# Patient Record
Sex: Male | Born: 1975 | Race: White | Hispanic: No | Marital: Single | Smoking: Current every day smoker
Health system: Southern US, Community
[De-identification: ages and names within clinical notes are randomized; demographics above are authoritative.]

## PROBLEM LIST (undated history)

## (undated) DIAGNOSIS — F200 Paranoid schizophrenia: Secondary | ICD-10-CM

---

## 2006-01-12 ENCOUNTER — Ambulatory Visit: Payer: Self-pay | Admitting: Internal Medicine

## 2007-11-27 IMAGING — US ABDOMEN ULTRASOUND
1 series · 17 of 25 positions shown · non-contrast
Comparison: none

REASON FOR EXAM: Elevated LFT's
COMMENTS:

[Series 1: abdomen ultrasound · 17 of 61 slices shown]
[im 1/61]
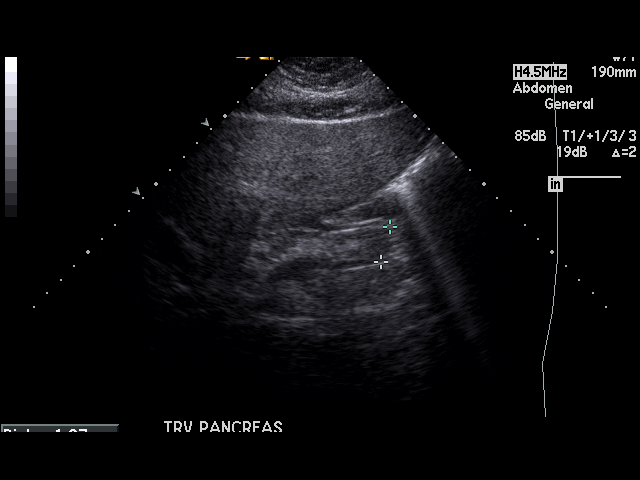
[im 6/61]
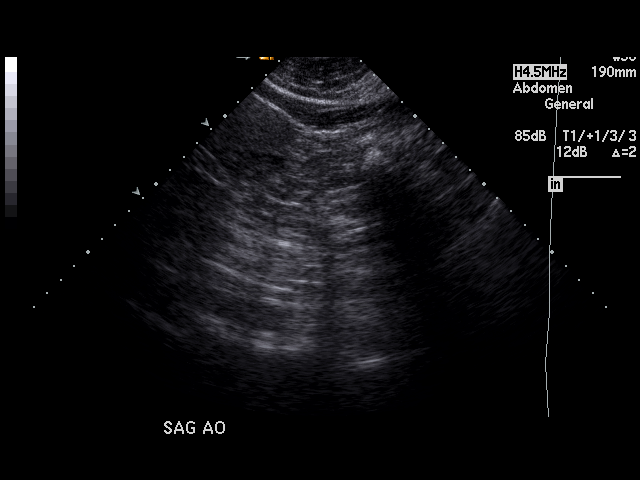
[im 8/61]
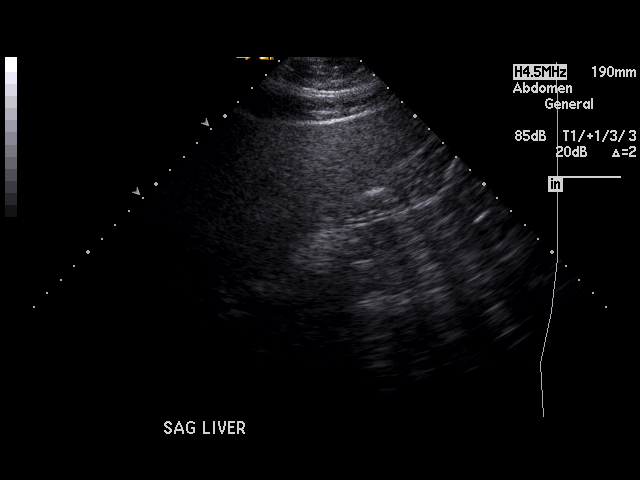
[im 13/61]
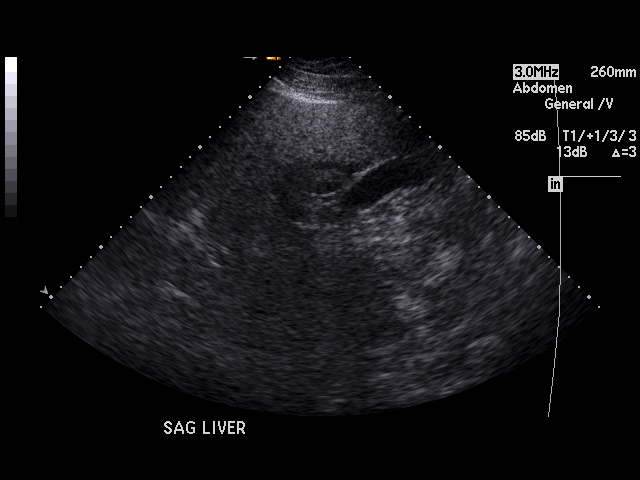
[im 16/61]
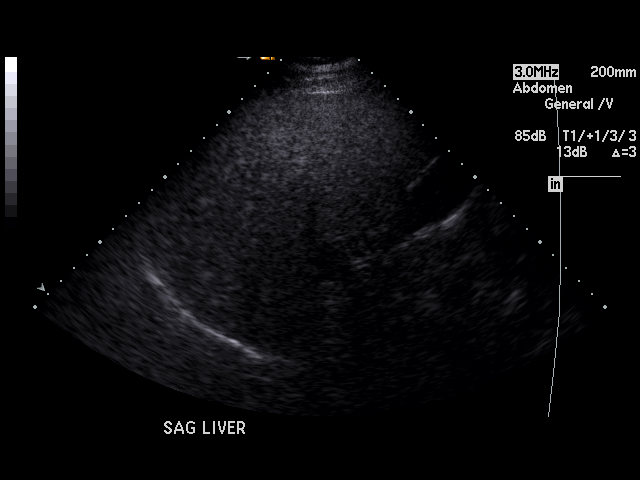
[im 21/61]
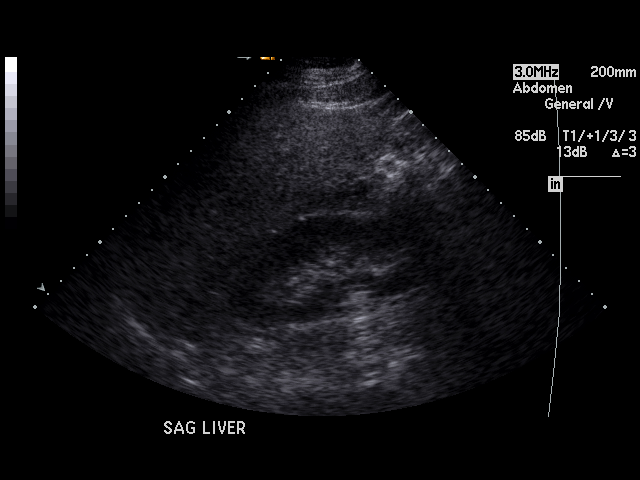
[im 23/61]
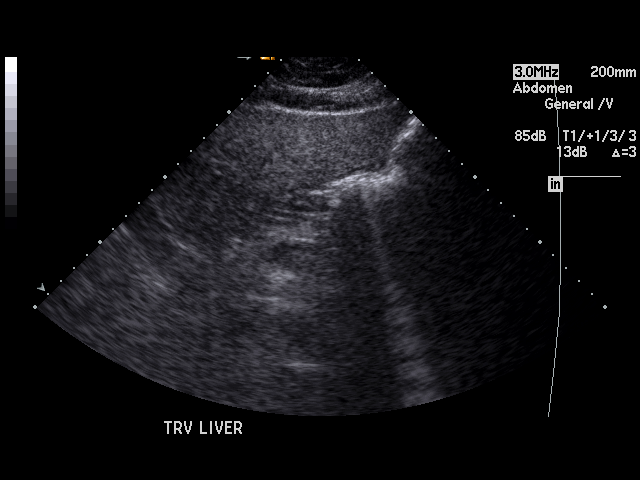
[im 28/61]
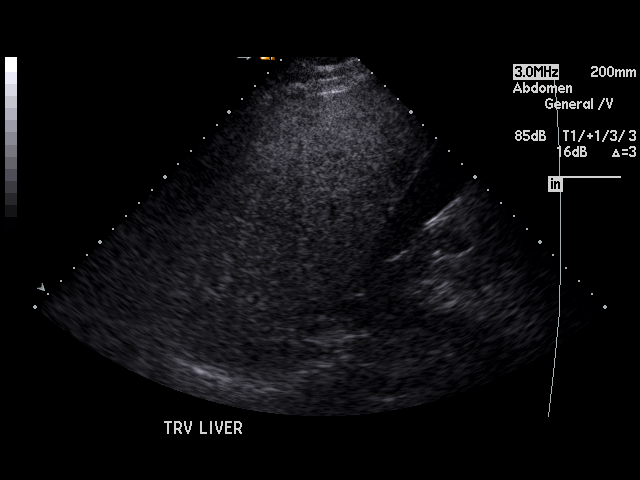
[im 31/61]
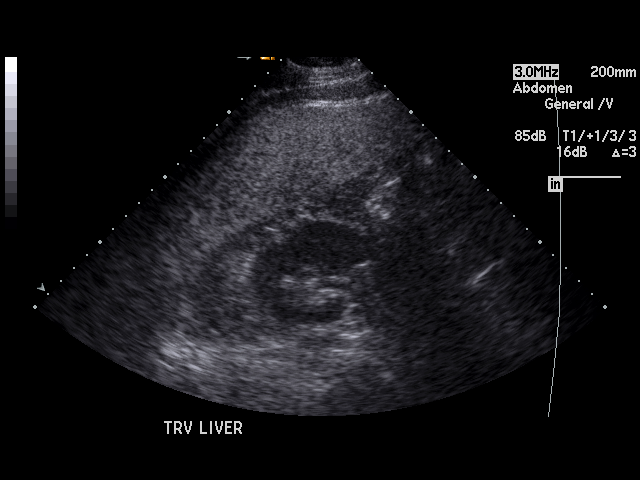
[im 33/61]
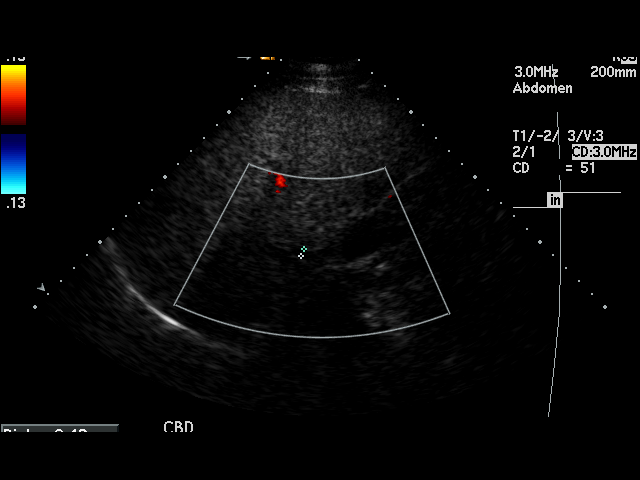
[im 38/61]
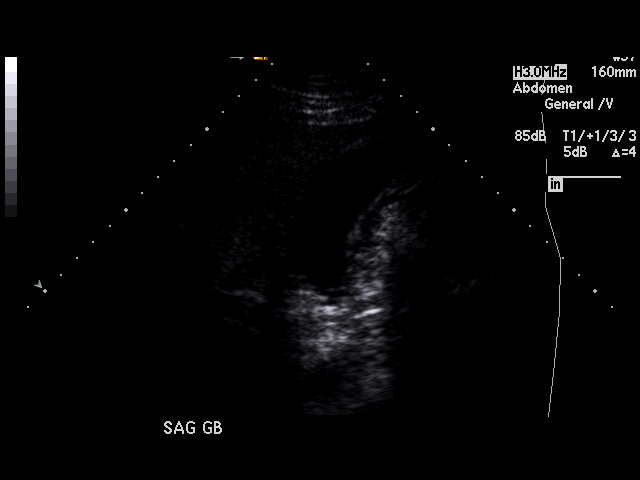
[im 41/61]
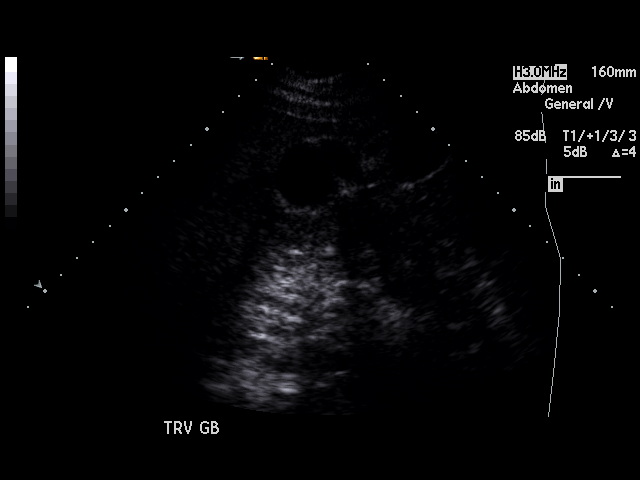
[im 46/61]
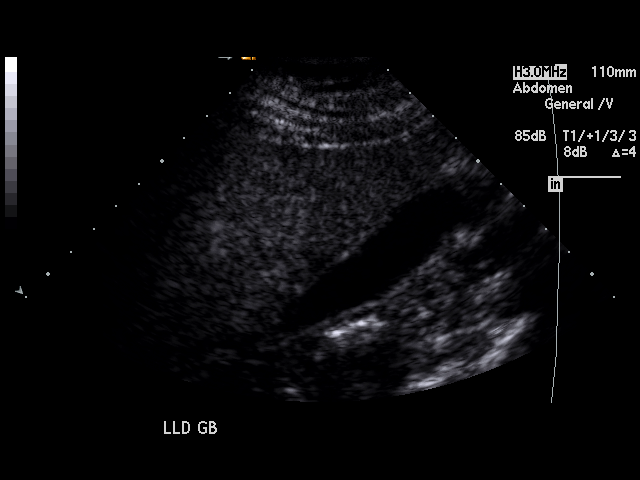
[im 48/61]
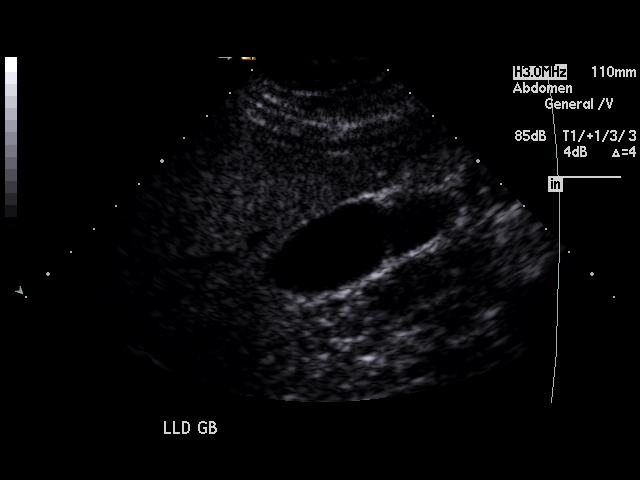
[im 53/61]
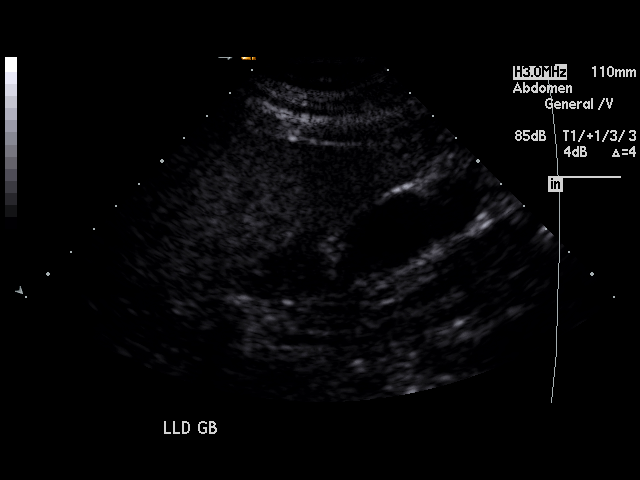
[im 56/61]
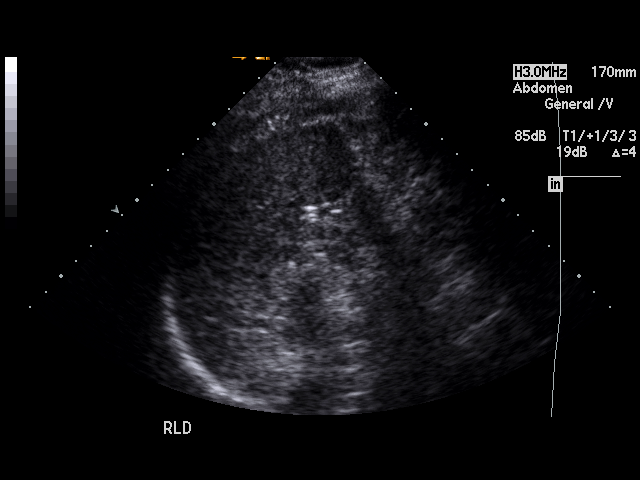
[im 61/61]
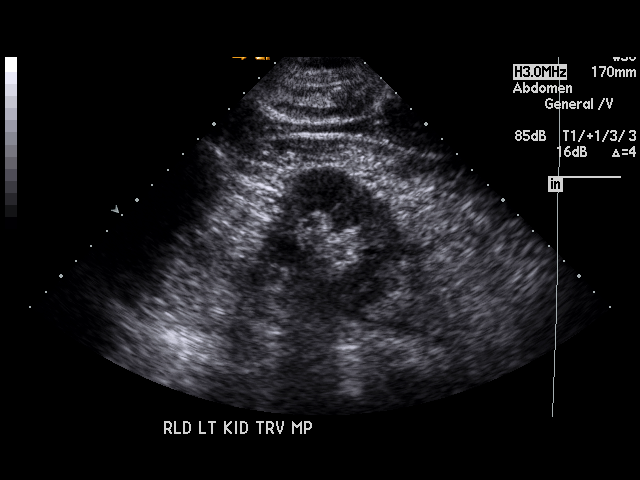

[17 of 25 positions shown; findings below may reference images not displayed]

PROCEDURE:     US  - US ABDOMEN GENERAL SURVEY  - January 12, 2006  [DATE]

RESULT:     The liver demonstrates an increased heterogeneous echotexture.
Common bile duct measures 4.2 mm in diameter.  There is no evidence of intra
or extrahepatic biliary ductal dilatation.  Hepatopetal flow is demonstrated
within the portal vein.  The spleen and kidneys are unremarkable.
Evaluation of the gallbladder demonstrates gallbladder wall thickness of
cm.  There is no evidence of pericholecystic, fluid, stones, sludging or
sonographic Murphy's sign.  The pancreas is not visualized secondary to
bowel gas.
IMPRESSION: Ultrasound findings consistent with hepatic steatosis without further
sonographic abnormalities.

## 2008-11-24 ENCOUNTER — Inpatient Hospital Stay: Payer: Self-pay | Admitting: Psychiatry

## 2009-02-12 ENCOUNTER — Inpatient Hospital Stay: Payer: Self-pay | Admitting: Psychiatry

## 2010-05-29 ENCOUNTER — Ambulatory Visit: Payer: Self-pay | Admitting: Internal Medicine

## 2022-09-22 ENCOUNTER — Emergency Department
Admission: EM | Admit: 2022-09-22 | Discharge: 2022-09-23 | Disposition: A | Discharge status: Home / Self Care | Payer: Medicare Other | Source: Home / Self Care | Attending: Emergency Medicine | Admitting: Emergency Medicine

## 2022-09-22 ENCOUNTER — Encounter: Payer: Self-pay | Admitting: Emergency Medicine

## 2022-09-22 ENCOUNTER — Other Ambulatory Visit: Payer: Self-pay

## 2022-09-22 DIAGNOSIS — F29 Unspecified psychosis not due to a substance or known physiological condition: Secondary | ICD-10-CM | POA: Insufficient documentation

## 2022-09-22 DIAGNOSIS — R45851 Suicidal ideations: Secondary | ICD-10-CM | POA: Insufficient documentation

## 2022-09-22 DIAGNOSIS — F200 Paranoid schizophrenia: Secondary | ICD-10-CM | POA: Insufficient documentation

## 2022-09-22 HISTORY — DX: Paranoid schizophrenia: F20.0

## 2022-09-22 LAB — URINE DRUG SCREEN, QUALITATIVE (ARMC ONLY)
Amphetamines, Ur Screen: NOT DETECTED
Barbiturates, Ur Screen: NOT DETECTED
Benzodiazepine, Ur Scrn: NOT DETECTED
Cannabinoid 50 Ng, Ur ~~LOC~~: NOT DETECTED
Cocaine Metabolite,Ur ~~LOC~~: NOT DETECTED
MDMA (Ecstasy)Ur Screen: NOT DETECTED
Methadone Scn, Ur: NOT DETECTED
Opiate, Ur Screen: NOT DETECTED
Phencyclidine (PCP) Ur S: NOT DETECTED
Tricyclic, Ur Screen: NOT DETECTED

## 2022-09-22 LAB — CBC
HCT: 49.3 % (ref 39.0–52.0)
Hemoglobin: 17.2 g/dL — ABNORMAL HIGH (ref 13.0–17.0)
MCH: 30.3 pg (ref 26.0–34.0)
MCHC: 34.9 g/dL (ref 30.0–36.0)
MCV: 86.8 fL (ref 80.0–100.0)
Platelets: 199 10*3/uL (ref 150–400)
RBC: 5.68 MIL/uL (ref 4.22–5.81)
RDW: 13.2 % (ref 11.5–15.5)
WBC: 7.8 10*3/uL (ref 4.0–10.5)
nRBC: 0 % (ref 0.0–0.2)

## 2022-09-22 LAB — COMPREHENSIVE METABOLIC PANEL
ALT: 17 U/L (ref 0–44)
AST: 19 U/L (ref 15–41)
Albumin: 4.5 g/dL (ref 3.5–5.0)
Alkaline Phosphatase: 100 U/L (ref 38–126)
Anion gap: 11 (ref 5–15)
BUN: 9 mg/dL (ref 6–20)
CO2: 22 mmol/L (ref 22–32)
Calcium: 9.1 mg/dL (ref 8.9–10.3)
Chloride: 102 mmol/L (ref 98–111)
Creatinine, Ser: 0.8 mg/dL (ref 0.61–1.24)
GFR, Estimated: >60 mL/min (ref >60)
Glucose, Bld: 131 mg/dL — ABNORMAL HIGH (ref 70–99)
Potassium: 2.8 mmol/L — ABNORMAL LOW (ref 3.5–5.1)
Sodium: 135 mmol/L (ref 135–145)
Total Bilirubin: 0.8 mg/dL (ref 0.3–1.2)
Total Protein: 7.6 g/dL (ref 6.5–8.1)

## 2022-09-22 LAB — SALICYLATE LEVEL: Salicylate Lvl: <7 mg/dL — ABNORMAL LOW (ref 7.0–30.0)

## 2022-09-22 LAB — ETHANOL: Alcohol, Ethyl (B): <10 mg/dL (ref <10)

## 2022-09-22 LAB — ACETAMINOPHEN LEVEL: Acetaminophen (Tylenol), Serum: <10 ug/mL — ABNORMAL LOW (ref 10–30)

## 2022-09-22 MED ORDER — POTASSIUM CHLORIDE CRYS ER 20 MEQ PO TBCR
40.0000 meq | EXTENDED_RELEASE_TABLET | Freq: Once | ORAL | Status: AC
  Administered 2022-09-22: 40 meq via ORAL
  Filled 2022-09-22: qty 2

## 2022-09-22 MED ORDER — HYDROCHLOROTHIAZIDE 12.5 MG PO TABS
12.5000 mg | ORAL_TABLET | Freq: Every day | ORAL | Status: DC
  Administered 2022-09-22 – 2022-09-23 (×2): 12.5 mg via ORAL
  Filled 2022-09-22 (×2): qty 1

## 2022-09-22 MED ORDER — LISINOPRIL 10 MG PO TABS
20.0000 mg | ORAL_TABLET | Freq: Every day | ORAL | Status: DC
  Administered 2022-09-22 – 2022-09-23 (×2): 20 mg via ORAL
  Filled 2022-09-22 (×2): qty 2

## 2022-09-22 NOTE — ED Triage Notes (Addendum)
Pt via [facility] from home. Pt under IVC. Pt has a hx of paranoid schizophrenia. Per mom, pt has not been taking medications. Pt has been "arguing and talking to people who aren't." Denies any SI/HI. Denies AVH. States he is an occasional drinker and smoker. Pt is calm and cooperative during triage. States that he does get biweekly risperodone shots, last shot was Friday but states he does miss some doses because it makes him feel slow.   Pt continue to state that he has been dealing with "consumptions" States she does not know why his mother thinks he is paranoid.

## 2022-09-22 NOTE — ED Provider Notes (Signed)
[facility] Provider Note    Event Date/Time   First MD Initiated Contact with Patient 09/22/22 2030     (approximate)   History   Chief Complaint Psychiatric Evaluation   HPI  Terrance Watkins is a 47 y.o. male with past medical history of schizophrenia who presents to the ED for psychiatric evaluation.  Patient brought to the ED under IVC after his mother stated he has not been taking his medications and has been "arguing with people who are not there."  Patient states that he is feeling fine, denies any suicidal or homicidal ideation.  He does state that he is seeking help with "consumptions" and would like to be evaluated.  He denies any specific medical complaints, denies any alcohol or drug use.     Physical Exam   Triage Vital Signs: ED Triage Vitals  Enc Vitals Group     BP 09/22/22 1805 (!) 234/111     Pulse Rate 09/22/22 1804 99     Resp 09/22/22 1804 18     Temp 09/22/22 1804 98.1 F (36.7 C)     Temp Source 09/22/22 1804 Oral     SpO2 09/22/22 1804 95 %     Weight 09/22/22 1802 215 lb (97.5 kg)     Height 09/22/22 1802 5\' 11"  (1.803 m)     Head Circumference --      Peak Flow --      Pain Score 09/22/22 1800 0     Pain Loc --      Pain Edu? --      Excl. in GC? --     Most recent vital signs: Vitals:   09/22/22 1804 09/22/22 1805  BP:  (!) 234/111  Pulse: 99   Resp: 18   Temp: 98.1 F (36.7 C)   SpO2: 95%     Constitutional: Alert and oriented. Eyes: Conjunctivae are normal. Head: Atraumatic. Nose: No congestion/rhinnorhea. Mouth/Throat: Mucous membranes are moist.  Cardiovascular: Normal rate, regular rhythm. Grossly normal heart sounds.  2+ radial pulses bilaterally. Respiratory: Normal respiratory effort.  No retractions. Lungs CTAB. Gastrointestinal: Soft and nontender. No distention. Musculoskeletal: No lower extremity tenderness nor edema.  Neurologic:  Normal speech and language. No gross focal neurologic deficits  are appreciated.    ED Results / Procedures / Treatments   Labs (all labs ordered are listed, but only abnormal results are displayed) Labs Reviewed  COMPREHENSIVE METABOLIC PANEL - Abnormal; Notable for the following components:      Result Value   Potassium 2.8 (*)    Glucose, Bld 131 (*)    All other components within normal limits  SALICYLATE LEVEL - Abnormal; Notable for the following components:   Salicylate Lvl <7.0 (*)    All other components within normal limits  ACETAMINOPHEN LEVEL - Abnormal; Notable for the following components:   Acetaminophen (Tylenol), Serum <10 (*)    All other components within normal limits  CBC - Abnormal; Notable for the following components:   Hemoglobin 17.2 (*)    All other components within normal limits  ETHANOL  URINE DRUG SCREEN, QUALITATIVE ([facility] ONLY)    PROCEDURES:  Critical Care performed: No  Procedures   MEDICATIONS ORDERED IN ED: Medications  lisinopril (ZESTRIL) tablet 20 mg (has no administration in time range)  hydrochlorothiazide (HYDRODIURIL) tablet 12.5 mg (has no administration in time range)  potassium chloride SA (KLOR-CON M) CR tablet 40 mEq (40 mEq Oral Given 09/22/22 2137)  IMPRESSION / MDM / ASSESSMENT AND PLAN / ED COURSE  I reviewed the triage vital signs and the nursing notes.                              48 y.o. male with past medical history of schizophrenia presents to the ED for psychiatric evaluation after mother reported that he was acting increasingly erratic and not taking his medications.  Patient's presentation is most consistent with acute presentation with potential threat to life or bodily function.  Differential diagnosis includes, but is not limited to, psychosis, depression, anxiety, medication noncompliance, substance abuse.  Patient nontoxic-appearing and in no acute distress, vital signs remarkable for hypertension but otherwise reassuring.  Patient denies any medical  complaints, does have erratic behavior but is calm and cooperative.  Labs are reassuring with hyperkalemia which we will replete, no significant AKI, anemia, or leukocytosis.  LFTs are unremarkable, Tylenol and salicylate levels are undetectable. Patient may be medically cleared for psychiatric disposition, no evidence of hypertensive emergency and we will restart his blood pressure medication. Psych eval pending at this time.  The patient has been placed in psychiatric observation due to the need to provide a safe environment for the patient while obtaining psychiatric consultation and evaluation, as well as ongoing medical and medication management to treat the patient's condition.  The patient has been placed under full IVC at this time.       FINAL CLINICAL IMPRESSION(S) / ED DIAGNOSES   Final diagnoses:  Psychosis, unspecified psychosis type (HCC)     Rx / DC Orders   ED Discharge Orders     None        Note:  This document was prepared using Dragon voice recognition software and may include unintentional dictation errors.   [name], MD 09/22/22 [phone]

## 2022-09-22 NOTE — ED Notes (Signed)
Pt. Alert and oriented, warm and dry, in no distress. Pt. Denies SI, HI, and AVH. Pt is having delusional thoughts and states some one has been placing wires in him. Pt. Encouraged to let nursing staff know of any concerns or needs.

## 2022-09-23 ENCOUNTER — Inpatient Hospital Stay
Admission: AD | Admit: 2022-09-23 | Discharge: 2022-10-03 | DRG: 885 | Disposition: A | Discharge status: Home / Self Care | Payer: Medicare Other | Source: Intra-hospital | Attending: Psychiatry | Admitting: Psychiatry

## 2022-09-23 DIAGNOSIS — R45851 Suicidal ideations: Secondary | ICD-10-CM

## 2022-09-23 DIAGNOSIS — F17210 Nicotine dependence, cigarettes, uncomplicated: Secondary | ICD-10-CM | POA: Diagnosis present

## 2022-09-23 DIAGNOSIS — Z79899 Other long term (current) drug therapy: Secondary | ICD-10-CM

## 2022-09-23 DIAGNOSIS — F200 Paranoid schizophrenia: Principal | ICD-10-CM | POA: Diagnosis present

## 2022-09-23 DIAGNOSIS — I10 Essential (primary) hypertension: Secondary | ICD-10-CM | POA: Diagnosis present

## 2022-09-23 DIAGNOSIS — F411 Generalized anxiety disorder: Secondary | ICD-10-CM | POA: Diagnosis present

## 2022-09-23 DIAGNOSIS — Z91148 Patient's other noncompliance with medication regimen for other reason: Secondary | ICD-10-CM | POA: Diagnosis not present

## 2022-09-23 DIAGNOSIS — G4700 Insomnia, unspecified: Secondary | ICD-10-CM | POA: Diagnosis present

## 2022-09-23 MED ORDER — DIPHENHYDRAMINE HCL 25 MG PO CAPS: 50.0000 mg | ORAL_CAPSULE | Freq: Three times a day (TID) | ORAL | Status: DC | PRN

## 2022-09-23 MED ORDER — ACETAMINOPHEN 325 MG PO TABS: 650.0000 mg | ORAL_TABLET | Freq: Four times a day (QID) | ORAL | Status: DC | PRN

## 2022-09-23 MED ORDER — LORAZEPAM 2 MG PO TABS: 2.0000 mg | ORAL_TABLET | Freq: Three times a day (TID) | ORAL | Status: DC | PRN

## 2022-09-23 MED ORDER — HALOPERIDOL 5 MG PO TABS: 5.0000 mg | ORAL_TABLET | Freq: Three times a day (TID) | ORAL | Status: DC | PRN

## 2022-09-23 MED ORDER — LISINOPRIL 20 MG PO TABS
20.0000 mg | ORAL_TABLET | Freq: Every day | ORAL | Status: DC
  Administered 2022-09-24 – 2022-10-03 (×10): 20 mg via ORAL
  Filled 2022-09-23 (×10): qty 1

## 2022-09-23 MED ORDER — TRAZODONE HCL 50 MG PO TABS: 50.0000 mg | ORAL_TABLET | Freq: Every evening | ORAL | Status: DC | PRN

## 2022-09-23 MED ORDER — HYDROXYZINE HCL 50 MG PO TABS: 50.0000 mg | ORAL_TABLET | Freq: Three times a day (TID) | ORAL | Status: DC | PRN

## 2022-09-23 MED ORDER — MAGNESIUM HYDROXIDE 400 MG/5ML PO SUSP: 30.0000 mL | Freq: Every day | ORAL | Status: DC | PRN

## 2022-09-23 MED ORDER — DIPHENHYDRAMINE HCL 50 MG/ML IJ SOLN: 50.0000 mg | Freq: Three times a day (TID) | INTRAMUSCULAR | Status: DC | PRN

## 2022-09-23 MED ORDER — ALUM & MAG HYDROXIDE-SIMETH 200-200-20 MG/5ML PO SUSP: 30.0000 mL | ORAL | Status: DC | PRN

## 2022-09-23 MED ORDER — NICOTINE 14 MG/24HR TD PT24
14.0000 mg | MEDICATED_PATCH | Freq: Every day | TRANSDERMAL | Status: DC
  Filled 2022-09-23 (×7): qty 1

## 2022-09-23 MED ORDER — HYDROCHLOROTHIAZIDE 12.5 MG PO TABS
12.5000 mg | ORAL_TABLET | Freq: Every day | ORAL | Status: DC
  Administered 2022-09-24 – 2022-09-30 (×7): 12.5 mg via ORAL
  Filled 2022-09-23 (×7): qty 1

## 2022-09-23 MED ORDER — LORAZEPAM 2 MG/ML IJ SOLN: 2.0000 mg | Freq: Three times a day (TID) | INTRAMUSCULAR | Status: DC | PRN

## 2022-09-23 MED ORDER — HALOPERIDOL LACTATE 5 MG/ML IJ SOLN: 5.0000 mg | Freq: Three times a day (TID) | INTRAMUSCULAR | Status: DC | PRN

## 2022-09-23 NOTE — BH Assessment (Signed)
Spoke with ACT Team Lead Liborio Nixon), and updated her about the patient been admitted to BMU.

## 2022-09-23 NOTE — ED Notes (Signed)
Patient is taking a shower at this time. 

## 2022-09-23 NOTE — ED Notes (Signed)
ivc/pending consult.. 
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
## 2022-09-23 NOTE — Tx Team (Signed)
Initial Treatment Plan 09/23/2022 3:49 PM Terrance Watkins ZOX:[id number]    PATIENT STRESSORS: Other: Delusional, paranoid     PATIENT STRENGTHS: Supportive family/friends    PATIENT IDENTIFIED PROBLEMS: Patient unable to identify a reason for being here. He continues to talk about "cultivations," and how his mom thinks he is paranoid but he is not.                     DISCHARGE CRITERIA:  Adequate post-discharge living arrangements Improved stabilization in mood, thinking, and/or behavior  PRELIMINARY DISCHARGE PLAN: Return to previous living arrangement  PATIENT/FAMILY INVOLVEMENT: This treatment plan has been presented to and reviewed with the patient, Terrance Watkins.  The patient and family have been given the opportunity to ask questions and make suggestions.  Cherre Blanc, RN 09/23/2022, 3:49 PM

## 2022-09-23 NOTE — Progress Notes (Signed)
Copy of LG papers placed in chart.

## 2022-09-23 NOTE — ED Notes (Signed)
Receiving RN took report for patient, however there is another new patient coming onto unit, so nurse will call once the other new admission is completed.

## 2022-09-23 NOTE — Plan of Care (Signed)
47 yo Caucasian male admitted on involuntary status R/T currently mental health status. Pt present A&O x 3, speech loud and rapid, with delusional and paranoid thoughts. Pt required constant redirections to stay on task, mood became irritable when redirected and he's not able to ramble on regarding his delusions. Pt refused to sign consent to sign his treatment agreement and restrictive process/procedure and denied having a guardian in which guardianship papers are now file in his records. Pt denied having any health issues except "a little stuff and I have not been taking my medications, Pt BP was elevated and he stated he not taking medications because he doesn't believe he has BP problems. Pt denied having SI, HI, A/V/H, he had a body search/inspection, feet has fungus otherwise skin unremarkable.Pt was oriented to the unit, given toiletries and offer  snacks. Will continue to monitor for safety and provide support as needed and according to plan of care.

## 2022-09-23 NOTE — Plan of Care (Signed)
  Problem: Physical Regulation: Goal: Ability to maintain clinical measurements within normal limits will improve Outcome: Progressing   Problem: Safety: Goal: Periods of time without injury will increase Outcome: Progressing   Problem: Education: Goal: Utilization of techniques to improve thought processes will improve Outcome: Progressing Goal: Knowledge of the prescribed therapeutic regimen will improve Outcome: Progressing   

## 2022-09-23 NOTE — ED Notes (Signed)
Lunch tray provided. 

## 2022-09-23 NOTE — BH Assessment (Signed)
Comprehensive Clinical Assessment (CCA) Note  09/23/2022 Terrance Watkins [id number]  Chief Complaint: Patient is a 47 year old male presenting to [facility] ED under IVC. Per triage note Pt via BPD from home. Pt under IVC. Pt has a hx of paranoid schizophrenia. Per mom, pt has not been taking medications. Pt has been "arguing and talking to people who aren't." Denies any SI/HI. Denies AVH. States he is an occasional drinker and smoker. Pt is calm and cooperative during triage. States that he does get biweekly risperodone shots, last shot was Friday but states he does miss some doses because it makes him feel slow. Pt continue to state that he has been dealing with "consumptions" States she does not know why his mother thinks he is paranoid. During assessment patient appears alert and oriented x4, calm and cooperative with delusions. Patient reports "I've had some breakdowns about cultivations, about my head and my scalp, I lost some hair." "I thought it was my genes." "My mom will get on my nerves, we could get a long better, she has some cultivations and so do I, she might have a cloning device." Patient is at times difficult to redirect when discussing his delusions. Patient reports that he lives with his mother and is being seen by [facility], he reports that he receives an injection every 2 weeks but reports "it feels like there's some additives in my medications, I tried to tell my doctor but she doesn't tell me anything." Patient reports that he has been with this ACT team for 2 years. Patient denies SI/HI/AH/VH.  Chief Complaint  Patient presents with   Psychiatric Evaluation   Visit Diagnosis: Schizophrenia    CCA Screening, Triage and Referral (STR)  Patient Reported Information How did you hear about [place]? Legal System  Referral name: No data recorded Referral phone number: No data recorded  Whom do you see for routine medical problems? No data recorded Practice/Facility Name: No data  recorded Practice/Facility Phone Number: No data recorded Name of Contact: No data recorded Contact Number: No data recorded Contact Fax Number: No data recorded Prescriber Name: No data recorded Prescriber Address (if known): No data recorded  What Is the Reason for Your Visit/Call Today? Pt via BPD from home. Pt under IVC. Pt has a hx of paranoid schizophrenia. Per mom, pt has not been taking medications. Pt has been "arguing and talking to people who aren't." Denies any SI/HI. Denies AVH. States he is an occasional drinker and smoker. Pt is calm and cooperative during triage. States that he does get biweekly risperodone shots, last shot was Friday but states he does miss some doses because it makes him feel slow.      Pt continue to state that he has been dealing with "consumptions" States she does not know why his mother thinks he is paranoid.  How Long Has This Been Causing You Problems? > than 6 months  What Do You Feel Would Help You the Most Today? No data recorded  Have You Recently Been in Any Inpatient Treatment (Hospital/Detox/Crisis Center/28-Day Program)? No data recorded Name/Location of Program/Hospital:No data recorded How Long Were You There? No data recorded When Were You Discharged? No data recorded  Have You Ever Received Services From [facility] Before? No data recorded Who Do You See at [facility]? No data recorded  Have You Recently Had Any Thoughts About Hurting Yourself? No  Are You Planning to Commit Suicide/Harm Yourself At This time? No   Have you  Recently Had Thoughts About Hurting Someone Karolee Ohs? No  Explanation: No data recorded  Have You Used Any Alcohol or Drugs in the Past 24 Hours? No  How Long Ago Did You Use Drugs or Alcohol? No data recorded What Did You Use and How Much? No data recorded  Do You Currently Have a Therapist/Psychiatrist? Yes  Name of Therapist/Psychiatrist: [facility]   Have You Been Recently Discharged From Any  Office Practice or Programs? No  Explanation of Discharge From Practice/Program: No data recorded    CCA Screening Triage Referral Assessment Type of Contact: Face-to-Face  Is this Initial or Reassessment? No data recorded Date Telepsych consult ordered in [facility]:  No data recorded Time Telepsych consult ordered in [facility]:  No data recorded  Patient Reported Information Reviewed? No data recorded Patient Left Without Being Seen? No data recorded Reason for Not Completing Assessment: No data recorded  Collateral Involvement: No data recorded  Does Patient Have a Court Appointed Legal Guardian? No data recorded Name and Contact of Legal Guardian: No data recorded If Minor and Not Living with Parent(s), Who has Custody? No data recorded Is CPS involved or ever been involved? Never  Is APS involved or ever been involved? Never   Patient Determined To Be At Risk for Harm To Self or Others Based on Review of Patient Reported Information or Presenting Complaint? No  Method: No data recorded Availability of Means: No data recorded Intent: No data recorded Notification Required: No data recorded Additional Information for Danger to Others Potential: No data recorded Additional Comments for Danger to Others Potential: No data recorded Are There Guns or Other Weapons in Your Home? No  Types of Guns/Weapons: No data recorded Are These Weapons Safely Secured?                            No data recorded Who Could Verify You Are Able To Have These Secured: No data recorded Do You Have any Outstanding Charges, Pending Court Dates, Parole/Probation? No data recorded Contacted To Inform of Risk of Harm To Self or Others: No data recorded  Location of Assessment: [facility] ED   Does Patient Present under Involuntary Commitment? Yes  IVC Papers Initial File Date: No data recorded  [place] of Residence:    Patient Currently Receiving the Following Services: ACTT Geographical information systems officer)   Determination of Need: Emergent (2 hours)   Options For Referral: No data recorded    CCA Biopsychosocial Intake/Chief Complaint:  No data recorded Current Symptoms/Problems: No data recorded  Patient Reported Schizophrenia/Schizoaffective Diagnosis in Past: Yes   Strengths: Patient is able to communicate his needs, has a ACT team  Preferences: No data recorded Abilities: No data recorded  Type of Services Patient Feels are Needed: No data recorded  Initial Clinical Notes/Concerns: No data recorded  Mental Health Symptoms Depression:   Difficulty Concentrating   Duration of Depressive symptoms:  Greater than two weeks   Mania:   None   Anxiety:    Difficulty concentrating; Restlessness; Worrying; Tension   Psychosis:   Delusions   Duration of Psychotic symptoms:  Greater than six months   Trauma:   None   Obsessions:   Poor insight; Recurrent & persistent thoughts/impulses/images   Compulsions:   Absent insight/delusional; Poor Insight   Inattention:   None   Hyperactivity/Impulsivity:   None   Oppositional/Defiant Behaviors:   None   Emotional Irregularity:   None   Other  Mood/Personality Symptoms:  No data recorded   Mental Status Exam Appearance and self-care  Stature:   Tall   Weight:   Overweight   Clothing:   Casual   Grooming:   Normal   Cosmetic use:   None   Posture/gait:   Normal   Motor activity:   Not Remarkable   Sensorium  Attention:   Normal   Concentration:   Normal   Orientation:   X5   Recall/memory:   Normal   Affect and Mood  Affect:   Appropriate   Mood:   Anxious   Relating  Eye contact:   Normal   Facial expression:   Responsive   Attitude toward examiner:   Cooperative   Thought and Language  Speech flow:  Flight of Ideas   Thought content:   Delusions   Preoccupation:   Ruminations   Hallucinations:   None   Organization:  No data recorded   [employer] of Knowledge:   Fair   Intelligence:   Average   Abstraction:   Normal   Judgement:   Fair   Dance movement psychotherapist:   Adequate   Insight:   Fair   Decision Making:   Normal   Social Functioning  Social Maturity:   Responsible   Social Judgement:   Normal   Stress  Stressors:   Family conflict   Coping Ability:   Normal   Skill Deficits:   None   Supports:   Friends/Service system; Family     Religion: Religion/Spirituality Are You A Religious Person?: No  Leisure/Recreation: Leisure / Recreation Do You Have Hobbies?: No  Exercise/Diet: Exercise/Diet Do You Exercise?: No Have You Gained or Lost A Significant Amount of Weight in the Past Six Months?: No Do You Follow a Special Diet?: No Do You Have Any Trouble Sleeping?: No   CCA Employment/Education Employment/Work Situation: Employment / Work Systems developer: On disability Why is Patient on Disability: Mental Health How Long has Patient Been on Disability: Unknown Has Patient ever Been in the [employer]?: No  Education: Education Is Patient Currently Attending School?: No Did Theme park manager?: No Did You Have An Individualized Education Program (IIEP): No Did You Have Any Difficulty At School?: No Patient's Education Has Been Impacted by Current Illness: No   CCA Family/Childhood History Family and Relationship History: Family history Marital status: Single Does patient have children?: No  Childhood History:  Childhood History By whom was/is the patient raised?: Mother Did patient suffer any verbal/emotional/physical/sexual abuse as a child?: No Did patient suffer from severe childhood neglect?: No Has patient ever been sexually abused/assaulted/raped as an adolescent or adult?: No Was the patient ever a victim of a crime or a disaster?: No Witnessed domestic violence?: No Has patient been affected by domestic violence as an adult?:  No  Child/Adolescent Assessment:     CCA Substance Use Alcohol/Drug Use: Alcohol / Drug Use Pain Medications: see mar Prescriptions: see mar Over the Counter: see mar History of alcohol / drug use?: No history of alcohol / drug abuse (Patient reports smoking cigarretes)                         ASAM's:  Six Dimensions of Multidimensional Assessment  Dimension 1:  Acute Intoxication and/or Withdrawal Potential:      Dimension 2:  Biomedical Conditions and Complications:      Dimension 3:  Emotional, Behavioral, or Cognitive Conditions and Complications:  Dimension 4:  Readiness to Change:     Dimension 5:  Relapse, Continued use, or Continued Problem Potential:     Dimension 6:  Recovery/Living Environment:     ASAM Severity Score:    ASAM Recommended Level of Treatment:     Substance use Disorder (SUD)    Recommendations for Services/Supports/Treatments:    DSM5 Diagnoses: There are no problems to display for this patient.   Patient Centered Plan: Patient is on the following Treatment Plan(s):  Anxiety   Referrals to Alternative Service(s): Referred to Alternative Service(s):   Place:   Date:   Time:    Referred to Alternative Service(s):   Place:   Date:   Time:    Referred to Alternative Service(s):   Place:   Date:   Time:    Referred to Alternative Service(s):   Place:   Date:   Time:      @BHCOLLABOFCARE @  [employer], LCAS-A

## 2022-09-24 MED ORDER — FLUPHENAZINE HCL 5 MG PO TABS
5.0000 mg | ORAL_TABLET | Freq: Two times a day (BID) | ORAL | Status: DC
  Administered 2022-09-24 – 2022-10-03 (×18): 5 mg via ORAL
  Filled 2022-09-24 (×19): qty 1

## 2022-09-24 MED ORDER — TRAZODONE HCL 100 MG PO TABS
100.0000 mg | ORAL_TABLET | Freq: Every day | ORAL | Status: DC
  Administered 2022-09-24: 100 mg via ORAL
  Filled 2022-09-24: qty 1

## 2022-09-24 NOTE — Progress Notes (Signed)
Patient is IVC'd to [facility] for paranoid schizophrenia by his guardian/ mother.  Patient states she is not his mother and wants DNA test. Asked multiple people for his belongings to check them specifically his wallet. Gave him a copy of the belongings list and advised his wallet and money were locked up in the safe. Hard to keep patient on track with questions but denies being suicidal - did not really answer AVH question directly. Took his medication this morning with no issues. Will continue to monitor.

## 2022-09-24 NOTE — Group Note (Signed)
Date:  09/24/2022 Time:  6:51 PM  Group Topic/Focus:  Activity Group:  The focus of the group is to encourage patients to go outside to the courtyard to get some fresh air for their physical and mental wellbeing.    Participation Level:  Active  Participation Quality:  Appropriate  Affect:  Appropriate  Cognitive:  Alert  Insight: Appropriate  Engagement in Group:  Engaged  Modes of Intervention:  Activity  Additional Comments:    Terrance Watkins 09/24/2022, 6:51 PM

## 2022-09-24 NOTE — BHH Counselor (Signed)
CSW attempted to meet with pt for completion of the PSA. During the interaction, pt shared that he needs help getting his ID because he does not know where it is. CSW explained that if he brought it down to the unit with him that it would have been inventoried, noted, and form would have been placed in his chart. Pt then began to talk about Reita May and [employer] and how they are his because the deed was never transferred. CSW shared that property ownership can be seen in public records online. CSW shared that he would check on his ID and the property questions. CSW attempted to ask questions from the assessment, however, pt focus remained directed towards things around the property and his ID. CSW would ask a question and pt would begin to respond before it quickly turned back to the property and ID. Pt throws out lots of names and keeps talking about consumption, wassar, and Zoe. CSW is able to ascertain that Clement Sayres is a part of [employer]. Pt and CSW discussed consent for contact with [facility]. He was agreeable with this as he does not want to be disconnected from them. No other concerns noted. Contact ended without incident.   CSW spoke with nurse who informed CSW that pt had already been given a copy of his inventory list. Pt was unable to participate in completing assessment at this time due to disorganization and perserevation on property ownership and his ID. Additional attempts will be utilized to complete assessment.   [name]. Algis Greenhouse, MSW, LCSW, LCAS 09/24/2022 4:29 PM

## 2022-09-24 NOTE — Group Note (Signed)
Date:  09/24/2022 Time:  6:58 PM  Group Topic/Focus:  Building Self Esteem:   The Focus of this group is helping patients become aware of the effects of self-esteem on their lives, the things they and others do that enhance or undermine their self-esteem, seeing the relationship between their level of self-esteem and the choices they make and learning ways to enhance self-esteem.    Participation Level:  Active  Participation Quality:  Appropriate  Affect:  Appropriate  Cognitive:  Appropriate  Insight: Appropriate  Engagement in Group:  Engaged  Modes of Intervention:  Activity  Additional Comments:    Edna Grover 09/24/2022, 6:58 PM

## 2022-09-24 NOTE — Group Note (Signed)
Date:  09/24/2022 Time:  8:57 PM  Group Topic/Focus:  Wrap-Up Group:   The focus of this group is to help patients review their daily goal of treatment and discuss progress on daily workbooks.    Participation Level:  Active  Participation Quality:  Appropriate  Affect:  Appropriate  Cognitive:  Appropriate  Insight: Appropriate  Engagement in Group:  Engaged  Modes of Intervention:  Activity  Additional Comments:    Kaytlen Lightsey 09/24/2022, 8:57 PM

## 2022-09-24 NOTE — BHH Counselor (Signed)
CSW notes that treatment team did not occur today as scheduled as directed by MD.  Penni Homans, MSW, LCSW 09/24/2022 4:06 PM

## 2022-09-24 NOTE — Progress Notes (Signed)
Patient noted pacing and walking in and out of his room this shift. Up most of night. Refused any prn for sleep stating he slept already. Incoherent speech. Denies SI, HI, AVH. Flat on approach but brightens.  Encouragement and support provided. Safety checks maintained. Medications given as prescribed. Pt receptive and remains safe on unit with q 15 min checks.

## 2022-09-24 NOTE — H&P (Addendum)
Psychiatric Admission Assessment Adult  Patient Identification: Terrance Watkins MRN:  [id number] Date of Evaluation:  09/24/2022 Chief Complaint:  Schizophrenia (HCC) [F20.9] Principal Diagnosis: Paranoid schizophrenia (HCC) Diagnosis:  Principal Problem:   Paranoid schizophrenia (HCC)  History of Present Illness: Terrance Watkins is a 47 y.o. male with past medical history of schizophrenia who presents to the ED for psychiatric evaluation.  Patient brought to the ED under IVC after his mother stated he has not been taking his medications and has been "arguing with people who are not there."   Patient seen today for evaluation.  Patient reports that he has been feeling anxious and is concerned why he is in the hospital.  Patient then went tangential talking about his mother.  Patient said that he needs to get a blood work done to see if" she is my biological mother".  Patient said that her mother has a" cuticle blood disease" he said" she has infected me".  Patient reports history of schizophrenia.  Patient said that Dr. Mervyn Skeeters is "cleansing me because I was touched by a bird and care was taken away".  Patient that then started rambling nonsensical with loose associations and flight of ideas.  Then he went on talking about his genes.  He said" my genes is about love, fun, care, and pleasure but I was touched by a filthy bird and now my blood is not pure" patient denies thoughts of harming himself or others.  He denies auditory visual hallucinations   Past Psychiatric History: Patient reports history of schizophrenia with multiple hospitalizations  Is the patient at risk to self? No.  Has the patient been a risk to self in the past 6 months? No.   Is the patient a risk to others? No.  Has the patient been a risk to others in the past 6 months? No.  Has the patient been a risk to others within the distant past? No.   [place] Scale:  Flowsheet Row Admission (Current) from 09/23/2022 in [facility] INPATIENT  BEHAVIORAL MEDICINE ED from 09/22/2022 in [facility] Emergency Department at [facility]  C-SSRS RISK CATEGORY No Risk No Risk        Prior Inpatient Therapy: Yes.     Prior Outpatient Therapy: Yes.      Alcohol Screening: Patient refused Alcohol Screening Tool:  (n) 1. How often do you have a drink containing alcohol?: 2 to 4 times a month 2. How many drinks containing alcohol do you have on a typical day when you are drinking?: 1 or 2 3. How often do you have six or more drinks on one occasion?: Never AUDIT-C Score: 2 4. How often during the last year have you found that you were not able to stop drinking once you had started?: Never 5. How often during the last year have you failed to do what was normally expected from you because of drinking?: Never 6. How often during the last year have you needed a first drink in the morning to get yourself going after a heavy drinking session?: Never 7. How often during the last year have you had a feeling of guilt of remorse after drinking?: Never 8. How often during the last year have you been unable to remember what happened the night before because you had been drinking?: Never 9. Have you or someone else been injured as a result of your drinking?: No 10. Has a relative or friend or a doctor or another health worker been concerned about your  drinking or suggested you cut down?: No Alcohol Use Disorder Identification Test Final Score (AUDIT): 2 Alcohol Brief Interventions/Follow-up: Alcohol education/Brief advice  Substance Abuse History in the last 12 months:   Patient denies use of alcohol.  Reports smokes marijuana and 1 pack of cigarettes per day .  Previous Psychotropic Medications: Yes  Psychological Evaluations: Yes  Past Medical History:  Past Medical History:  Diagnosis Date   Paranoid schizophrenia (HCC)    History reviewed. No pertinent surgical history. Family History: History reviewed. No pertinent family  history.  Tobacco Screening:  Social History   Tobacco Use  Smoking Status Every Day   Types: Cigarettes  Smokeless Tobacco Former    BH Tobacco Counseling     Are you interested in Tobacco Cessation Medications?  Yes, implement Nicotene Replacement Protocol Counseled patient on smoking cessation:  Yes Reason Tobacco Screening Not Completed: No value filed.       Social History:  Social History   Substance and Sexual Activity  Alcohol Use Yes   Alcohol/week: 2.0 standard drinks of alcohol   Types: 2 Cans of beer per week     Social History   Substance and Sexual Activity  Drug Use Yes   Types: Marijuana    Additional Social History:                           Allergies:  No Known Allergies Lab Results:  Results for orders placed or performed during the hospital encounter of 09/22/22 (from the past 48 hour(s))  Urine Drug Screen, Qualitative     Status: None   Collection Time: 09/22/22  6:12 PM  Result Value Ref Range   Tricyclic, Ur Screen NONE DETECTED NONE DETECTED   Amphetamines, Ur Screen NONE DETECTED NONE DETECTED   MDMA (Ecstasy)Ur Screen NONE DETECTED NONE DETECTED   Cocaine Metabolite,Ur [place] NONE DETECTED NONE DETECTED   Opiate, Ur Screen NONE DETECTED NONE DETECTED   Phencyclidine (PCP) Ur S NONE DETECTED NONE DETECTED   Cannabinoid 50 Ng, Ur [place] NONE DETECTED NONE DETECTED   Barbiturates, Ur Screen NONE DETECTED NONE DETECTED   Benzodiazepine, Ur Scrn NONE DETECTED NONE DETECTED   Methadone Scn, Ur NONE DETECTED NONE DETECTED    Comment: (NOTE) Tricyclics + metabolites, urine    Cutoff 1000 ng/mL Amphetamines + metabolites, urine  Cutoff 1000 ng/mL MDMA (Ecstasy), urine              Cutoff 500 ng/mL Cocaine Metabolite, urine          Cutoff 300 ng/mL Opiate + metabolites, urine        Cutoff 300 ng/mL Phencyclidine (PCP), urine         Cutoff 25 ng/mL Cannabinoid, urine                 Cutoff 50 ng/mL Barbiturates + metabolites, urine   Cutoff 200 ng/mL Benzodiazepine, urine              Cutoff 200 ng/mL Methadone, urine                   Cutoff 300 ng/mL  The urine drug screen provides only a preliminary, unconfirmed analytical test result and should not be used for non-medical purposes. Clinical consideration and professional judgment should be applied to any positive drug screen result due to possible interfering substances. A more specific alternate chemical method must be used in order to obtain a confirmed analytical result. Gas  chromatography / mass spectrometry (GC/MS) is the preferred confirm atory method. Performed at [facility], [address] [place], [place] [address]   Comprehensive metabolic panel     Status: Abnormal   Collection Time: 09/22/22  6:13 PM  Result Value Ref Range   Sodium 135 135 - 145 mmol/L   Potassium 2.8 (L) 3.5 - 5.1 mmol/L   Chloride 102 98 - 111 mmol/L   CO2 22 22 - 32 mmol/L   Glucose, Bld 131 (H) 70 - 99 mg/dL    Comment: Glucose reference range applies only to samples taken after fasting for at least 8 hours.   BUN 9 6 - 20 mg/dL   Creatinine, Ser 6.64 0.61 - 1.24 mg/dL   Calcium 9.1 8.9 - 40.3 mg/dL   Total Protein 7.6 6.5 - 8.1 g/dL   Albumin 4.5 3.5 - 5.0 g/dL   AST 19 15 - 41 U/L   ALT 17 0 - 44 U/L   Alkaline Phosphatase 100 38 - 126 U/L   Total Bilirubin 0.8 0.3 - 1.2 mg/dL   GFR, Estimated >47 >42 mL/min    Comment: (NOTE) Calculated using the CKD-EPI Creatinine Equation (2021)    Anion gap 11 5 - 15    Comment: Performed at [facility], [address] [place], [place] [address]  Ethanol     Status: None   Collection Time: 09/22/22  6:13 PM  Result Value Ref Range   Alcohol, Ethyl (B) <10 <10 mg/dL    Comment: (NOTE) Lowest detectable limit for serum alcohol is 10 mg/dL.  For medical purposes only. Performed at [facility], [address] [place], [place] [address]   Salicylate level     Status: Abnormal    Collection Time: 09/22/22  6:13 PM  Result Value Ref Range   Salicylate Lvl <7.0 (L) 7.0 - 30.0 mg/dL    Comment: Performed at [facility], [address] [place], [place] [address]  Acetaminophen level     Status: Abnormal   Collection Time: 09/22/22  6:13 PM  Result Value Ref Range   Acetaminophen (Tylenol), Serum <10 (L) 10 - 30 ug/mL    Comment: (NOTE) Therapeutic concentrations vary significantly. A range of 10-30 ug/mL  may be an effective concentration for many patients. However, some  are best treated at concentrations outside of this range. Acetaminophen concentrations >150 ug/mL at 4 hours after ingestion  and >50 ug/mL at 12 hours after ingestion are often associated with  toxic reactions.  Performed at [facility], [address] [place], [place] [address]   cbc     Status: Abnormal   Collection Time: 09/22/22  6:13 PM  Result Value Ref Range   WBC 7.8 4.0 - 10.5 K/uL   RBC 5.68 4.22 - 5.81 MIL/uL   Hemoglobin 17.2 (H) 13.0 - 17.0 g/dL   HCT 66.0 63.0 - 16.0 %   MCV 86.8 80.0 - 100.0 fL   MCH 30.3 26.0 - 34.0 pg   MCHC 34.9 30.0 - 36.0 g/dL   RDW 10.9 32.3 - 55.7 %   Platelets 199 150 - 400 K/uL   nRBC 0.0 0.0 - 0.2 %    Comment: Performed at [facility], [address], [place], [place] [address]    Blood Alcohol level:  Lab Results  Component Value Date   [facility] <10 09/22/2022    Metabolic Disorder Labs:  No results found for: "HGBA1C", "MPG" No results found for: "PROLACTIN"  No results found for: "CHOL", "TRIG", "HDL", "CHOLHDL", "VLDL", "LDLCALC"  Current Medications: Current Facility-Administered Medications  Medication Dose Route Frequency Provider Last Rate Last Admin   acetaminophen (TYLENOL) tablet 650 mg  650 mg Oral Q6H PRN Bennett, Christal H, NP       alum & mag hydroxide-simeth (MAALOX/MYLANTA) 200-200-20 MG/5ML suspension 30 mL  30 mL Oral Q4H PRN Bennett, Christal H, NP       diphenhydrAMINE (BENADRYL)  capsule 50 mg  50 mg Oral TID PRN Bennett, Christal H, NP       Or   diphenhydrAMINE (BENADRYL) injection 50 mg  50 mg Intramuscular TID PRN Bennett, Christal H, NP       haloperidol (HALDOL) tablet 5 mg  5 mg Oral TID PRN Bennett, Christal H, NP       Or   haloperidol lactate (HALDOL) injection 5 mg  5 mg Intramuscular TID PRN Bennett, Christal H, NP       hydrochlorothiazide (HYDRODIURIL) tablet 12.5 mg  12.5 mg Oral Daily Bennett, Christal H, NP   12.5 mg at 09/24/22 1610   hydrOXYzine (ATARAX) tablet 50 mg  50 mg Oral TID PRN Bennett, Christal H, NP       lisinopril (ZESTRIL) tablet 20 mg  20 mg Oral Daily Bennett, Christal H, NP   20 mg at 09/24/22 0824   LORazepam (ATIVAN) tablet 2 mg  2 mg Oral TID PRN Bennett, Christal H, NP       Or   LORazepam (ATIVAN) injection 2 mg  2 mg Intramuscular TID PRN Bennett, Christal H, NP       magnesium hydroxide (MILK OF MAGNESIA) suspension 30 mL  30 mL Oral Daily PRN Bennett, Christal H, NP       nicotine (NICODERM CQ - dosed in mg/24 hours) patch 14 mg  14 mg Transdermal Daily Lewanda Rife, MD       traZODone (DESYREL) tablet 50 mg  50 mg Oral QHS PRN Bennett, Christal H, NP       PTA Medications: Medications Prior to Admission  Medication Sig Dispense Refill Last Dose   fluPHENAZine (PROLIXIN) 10 MG tablet Take 10 mg by mouth 3 (three) times daily.      RISPERDAL CONSTA 25 MG injection Inject 25 mg into the muscle every 14 (fourteen) days.      RISPERDAL CONSTA 50 MG injection Inject 50 mg into the muscle every 14 (fourteen) days.      traZODone (DESYREL) 100 MG tablet Take 200 mg by mouth at bedtime as needed.       Musculoskeletal: Strength & Muscle Tone: within normal limits Gait & Station: normal Patient leans: N/A    Psychiatric Specialty Exam:  Presentation  General Appearance:  Appropriate for Environment; Casual  Eye Contact: Good  Speech: Clear and Coherent Hyperverbal  Speech Volume: Normal  Handedness:No  data recorded  Mood and Affect  Mood: Anxious  Affect: Appropriate   Thought Process  Thought Processes: Disorganized with flight of ideas and loose associations  Past Diagnosis of Schizophrenia or Psychoactive disorder: Yes  Descriptions of Associations:Loose  Orientation:Full (Time, Place and Person)  Thought Content:Delusional paranoia  Hallucinations:Hallucinations: None  Ideas of Reference:None  Suicidal Thoughts:Suicidal Thoughts: No  Homicidal Thoughts:Homicidal Thoughts: No   Sensorium  Memory: Immediate Fair; Recent Fair  Judgment: Impaired  Insight: Lacking   Executive Functions  Concentration: Fair  Attention Span: Fair  Recall:No data recorded Fund of Knowledge: Fair  Language: Fair   Psychomotor Activity  Psychomotor Activity: Hyperactive   Assets  Assets: Manufacturing systems engineer; Housing; Social Support; Physical Health   Sleep  Sleep: Sleep: Fair    Physical Exam: Physical Exam Constitutional:      Appearance: Normal appearance.  HENT:     Head: Atraumatic.     Nose: Nose normal.  Eyes:     Pupils: Pupils are equal, round, and reactive to light.  Cardiovascular:     Rate and Rhythm: Normal rate.  Pulmonary:     Effort: Pulmonary effort is normal.  Skin:    General: Skin is warm and dry.  Neurological:     General: No focal deficit present.     Mental Status: He is alert.    Review of Systems  Constitutional: Negative.   HENT: Negative.    Eyes: Negative.   Respiratory: Negative.    Cardiovascular: Negative.   Gastrointestinal: Negative.   Genitourinary: Negative.   Skin: Negative.   Neurological: Negative.    Blood pressure 136/82, pulse 72, temperature 97.7 F (36.5 C), temperature source Oral, resp. rate 18, height 5\' 11"  (1.803 m), SpO2 97%. Body mass index is 29.99 kg/m.  Treatment Plan Summary: Daily contact with patient to assess and evaluate symptoms and progress in treatment and Medication  management  Observation Level/Precautions:  15 minute checks  Laboratory:   Psychotherapy:    Medications:  Risperdal Consta, Prolixin PO  Consultations:    Discharge Concerns:    Estimated LOS:  Other:     Physician Treatment Plan for Primary Diagnosis: Paranoid schizophrenia (HCC) Long Term Goal(s): Improvement in symptoms so as ready for discharge  Short Term Goals: Ability to identify changes in lifestyle to reduce recurrence of condition will improve, Ability to verbalize feelings will improve, Ability to disclose and discuss suicidal ideas, Ability to demonstrate self-control will improve, Ability to identify and develop effective coping behaviors will improve, Compliance with prescribed medications will improve, and Ability to identify triggers associated with substance abuse/mental health issues will improve   I certify that inpatient services furnished can reasonably be expected to improve the patient's condition.    Lewanda Rife, MD

## 2022-09-24 NOTE — BHH Suicide Risk Assessment (Signed)
[facility] Admission Suicide Risk Assessment   Nursing information obtained from:  Patient Demographic factors:  Male Current Mental Status:  NA Loss Factors:  NA Historical Factors:  NA Risk Reduction Factors:  NA  Principal Problem: Paranoid schizophrenia (HCC) Diagnosis:  Principal Problem:   Paranoid schizophrenia (HCC)  Subjective Data:  Terrance Watkins is a 47 y.o. male with past medical history of schizophrenia who presents to the ED for psychiatric evaluation.  Patient brought to the ED under IVC after his mother stated he has not been taking his medications and has been "arguing with people who are not there."   Patient seen today for evaluation.  Patient reports that he has been feeling anxious and is concerned why he is in the hospital.  Patient then went tangential talking about his mother.  Patient said that he needs to get a blood work done to see if" she is my biological mother".  Patient said that her mother has a" cuticle blood disease" he said" she has infected me".  Patient reports history of schizophrenia.  Patient said that Dr. Mervyn Skeeters is "cleansing me because I was touched by a bird and care was taken away".  Patient that then started rambling nonsensical with loose associations and flight of ideas.  Then he went on talking about his genes.  He said" my genes is about love, fun, care, and pleasure but I was touched by a filthy bird and now my blood is not pure" patient denies thoughts of harming himself or others.  He denies auditory visual hallucinations  Continued Clinical Symptoms:  Alcohol Use Disorder Identification Test Final Score (AUDIT): 2 The "Alcohol Use Disorders Identification Test", Guidelines for Use in Primary Care, Second Edition.  World Science writer [facility]). Score between 0-7:  no or low risk or alcohol related problems. Score between 8-15:  moderate risk of alcohol related problems. Score between 16-19:  high risk of alcohol related problems. Score 20 or above:   warrants further diagnostic evaluation for alcohol dependence and treatment.   CLINICAL FACTORS:   Schizophrenia:   Paranoid or undifferentiated type   Musculoskeletal: Strength & Muscle Tone: within normal limits Gait & Station: normal Patient leans: N/A  Psychiatric Specialty Exam:  Presentation  General Appearance:  Appropriate for Environment; Casual  Eye Contact: Good  Speech: Clear and Coherent Hyperverbal  Speech Volume: Normal  Handedness:No data recorded  Mood and Affect  Mood: Anxious  Affect: Appropriate   Thought Process  Thought Processes: Disorganized with flight of ideas and loose associations  Past Diagnosis of Schizophrenia or Psychoactive disorder: Yes  Descriptions of Associations:Loose  Orientation:Full (Time, Place and Person)  Thought Content:Delusional paranoia  Hallucinations:Hallucinations: None  Ideas of Reference:None  Suicidal Thoughts:Suicidal Thoughts: No  Homicidal Thoughts:Homicidal Thoughts: No   Sensorium  Memory: Immediate Fair; Recent Fair  Judgment: Impaired  Insight: Lacking   Executive Functions  Concentration: Fair  Attention Span: Fair  Language: Fair   Psychomotor Activity  Psychomotor Activity: Hyperactive   Assets  Assets: Manufacturing systems engineer; Housing; Social Support; Physical Health   Sleep  Sleep: Sleep: Fair    Psychomotor Activity  Psychomotor Activity:Psychomotor Activity: Normal   Assets  Assets:Communication Skills; Housing; Social Support; Physical Health   Sleep  Sleep:Sleep: Fair    Physical Exam:  Blood pressure 136/82, pulse 72, temperature 97.7 F (36.5 C), temperature source Oral, resp. rate 18, height 5\' 11"  (1.803 m), SpO2 97%. Body mass index is 29.99 kg/m.   COGNITIVE FEATURES THAT CONTRIBUTE TO RISK:  Polarized thinking    SUICIDE RISK:   Minimal: No identifiable suicidal ideation.  Patients presenting with no risk factors but with  morbid ruminations; may be classified as minimal risk based on the severity of the depressive symptoms  Per H&P  I certify that inpatient services furnished can reasonably be expected to improve the patient's condition.   Lewanda Rife, MD

## 2022-09-24 NOTE — Plan of Care (Signed)
Problem: Education: Goal: Knowledge of General Education information will improve Description: Including pain rating scale, medication(s)/side effects and non-pharmacologic comfort measures Outcome: Progressing   Problem: Health Behavior/Discharge Planning: Goal: Ability to manage health-related needs will improve Outcome: Progressing   Problem: Clinical Measurements: Goal: Ability to maintain clinical measurements within normal limits will improve Outcome: Progressing Goal: Will remain free from infection Outcome: Progressing Goal: Diagnostic test results will improve Outcome: Progressing Goal: Respiratory complications will improve Outcome: Progressing Goal: Cardiovascular complication will be avoided Outcome: Progressing   Problem: Activity: Goal: Risk for activity intolerance will decrease Outcome: Progressing   Problem: Nutrition: Goal: Adequate nutrition will be maintained Outcome: Progressing   Problem: Coping: Goal: Level of anxiety will decrease Outcome: Progressing   Problem: Elimination: Goal: Will not experience complications related to bowel motility Outcome: Progressing Goal: Will not experience complications related to urinary retention Outcome: Progressing   Problem: Pain Managment: Goal: General experience of comfort will improve Outcome: Progressing   Problem: Safety: Goal: Ability to remain free from injury will improve Outcome: Progressing   Problem: Skin Integrity: Goal: Risk for impaired skin integrity will decrease Outcome: Progressing   Problem: Education: Goal: Knowledge of [place] [facility] Education information/materials will improve Outcome: Progressing Goal: Emotional status will improve Outcome: Progressing Goal: Mental status will improve Outcome: Progressing Goal: Verbalization of understanding the information provided will improve Outcome: Progressing   Problem: Activity: Goal: Interest or engagement in activities will  improve Outcome: Progressing Goal: Sleeping patterns will improve Outcome: Progressing   Problem: Coping: Goal: Ability to verbalize frustrations and anger appropriately will improve Outcome: Progressing Goal: Ability to demonstrate self-control will improve Outcome: Progressing   Problem: Health Behavior/Discharge Planning: Goal: Identification of resources available to assist in meeting health care needs will improve Outcome: Progressing Goal: Compliance with treatment plan for underlying cause of condition will improve Outcome: Progressing   Problem: Physical Regulation: Goal: Ability to maintain clinical measurements within normal limits will improve Outcome: Progressing   Problem: Safety: Goal: Periods of time without injury will increase Outcome: Progressing   Problem: Education: Goal: Utilization of techniques to improve thought processes will improve Outcome: Progressing Goal: Knowledge of the prescribed therapeutic regimen will improve Outcome: Progressing   Problem: Activity: Goal: Interest or engagement in leisure activities will improve Outcome: Progressing Goal: Imbalance in normal sleep/wake cycle will improve Outcome: Progressing   Problem: Coping: Goal: Coping ability will improve Outcome: Progressing Goal: Will verbalize feelings Outcome: Progressing   Problem: Health Behavior/Discharge Planning: Goal: Ability to make decisions will improve Outcome: Progressing Goal: Compliance with therapeutic regimen will improve Outcome: Progressing   Problem: Role Relationship: Goal: Will demonstrate positive changes in social behaviors and relationships Outcome: Progressing   Problem: Safety: Goal: Ability to disclose and discuss suicidal ideas will improve Outcome: Progressing Goal: Ability to identify and utilize support systems that promote safety will improve Outcome: Progressing   Problem: Self-Concept: Goal: Will verbalize positive feelings about  self Outcome: Progressing Goal: Level of anxiety will decrease Outcome: Progressing   Problem: Activity: Goal: Will verbalize the importance of balancing activity with adequate rest periods Outcome: Progressing   Problem: Education: Goal: Will be free of psychotic symptoms Outcome: Progressing Goal: Knowledge of the prescribed therapeutic regimen will improve Outcome: Progressing   Problem: Coping: Goal: Coping ability will improve Outcome: Progressing Goal: Will verbalize feelings Outcome: Progressing   Problem: Health Behavior/Discharge Planning: Goal: Compliance with prescribed medication regimen will improve Outcome: Progressing   Problem: Nutritional: Goal: Ability to achieve adequate nutritional intake  will improve Outcome: Progressing

## 2022-09-24 NOTE — Group Note (Signed)
Recreation Therapy Group Note   Group Topic:Leisure Education  Group Date: 09/24/2022 Start Time: 1000 End Time: 1100 Facilitators: Rosina Lowenstein, LRT, CTRS Location: Craft Room  Group Description: Leisure. Patients were given the option to choose from coloring, singing karaoke, making origami, journaling or listening to music. LRT and pts discussed the meaning of leisure, the importance of participating in leisure during their free time/when they're outside of the hospital, as well as how our leisure interests can also serve as coping skills. Pt identified two leisure interests and shared with the group.   Goal Area(s) Addressed:  Patient will identify a current leisure interest.  Patient will learn the definition of "leisure". Patient will practice making a positive decision. Patient will have the opportunity to try a new leisure activity. Patient will communicate with peers and LRT.     Affect/Mood: Appropriate   Participation Level: Active and Engaged   Participation Quality: Independent   Behavior: Bizarre   Speech/Thought Process: Disorganized and Irrational   Insight: Limited   Judgement: Fair    Modes of Intervention: Activity   Patient Response to Interventions:  Receptive   Education Outcome:  Acknowledges education   Clinical Observations/Individualized Feedback: Kymari was active in their participation of session activities and group discussion. Pt identified "I like to work on cars and buy parts for them" as things he does in his free time. Pt chose to journal during group. Pt stayed after to talk one on one with LRT. 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 Pt also talked about the [place] government and holograms at the [place]. Pt interacted appropriate in group and is pleasant to speak with, pt doesn't make much sense.   Plan: Continue to engage patient in RT group sessions  2-3x/week.   Rosina Lowenstein, LRT, CTRS 09/24/2022 11:40 AM

## 2022-09-24 NOTE — Plan of Care (Signed)
  Problem: Education: Goal: Knowledge of General Education information will improve Description: Including pain rating scale, medication(s)/side effects and non-pharmacologic comfort measures Outcome: Not Progressing   Problem: Nutrition: Goal: Adequate nutrition will be maintained Outcome: Progressing   Problem: Safety: Goal: Ability to remain free from injury will improve Outcome: Progressing   Problem: Education: Goal: Emotional status will improve Outcome: Not Progressing Goal: Mental status will improve Outcome: Not Progressing Goal: Verbalization of understanding the information provided will improve Outcome: Not Progressing   Problem: Activity: Goal: Sleeping patterns will improve Outcome: Not Progressing   Problem: Coping: Goal: Ability to verbalize frustrations and anger appropriately will improve Outcome: Not Progressing Goal: Ability to demonstrate self-control will improve Outcome: Progressing

## 2022-09-25 DIAGNOSIS — F200 Paranoid schizophrenia: Secondary | ICD-10-CM

## 2022-09-25 LAB — LIPID PANEL
Cholesterol: 162 mg/dL (ref 0–200)
HDL: 40 mg/dL — ABNORMAL LOW (ref >40)
LDL Cholesterol: 103 mg/dL — ABNORMAL HIGH (ref 0–99)
Total CHOL/HDL Ratio: 4.1 RATIO
Triglycerides: 96 mg/dL (ref <150)
VLDL: 19 mg/dL (ref 0–40)

## 2022-09-25 LAB — TSH: TSH: 1.755 u[IU]/mL (ref 0.350–4.500)

## 2022-09-25 MED ORDER — DIVALPROEX SODIUM 500 MG PO DR TAB
500.0000 mg | DELAYED_RELEASE_TABLET | Freq: Two times a day (BID) | ORAL | Status: DC
  Administered 2022-09-25 – 2022-10-03 (×16): 500 mg via ORAL
  Filled 2022-09-25 (×16): qty 1

## 2022-09-25 MED ORDER — GABAPENTIN 100 MG PO CAPS
200.0000 mg | ORAL_CAPSULE | Freq: Three times a day (TID) | ORAL | Status: DC
  Administered 2022-09-25 – 2022-09-28 (×4): 200 mg via ORAL
  Filled 2022-09-25 (×9): qty 2

## 2022-09-25 MED ORDER — TRAZODONE HCL 100 MG PO TABS
200.0000 mg | ORAL_TABLET | Freq: Every day | ORAL | Status: DC
  Administered 2022-09-25 – 2022-10-02 (×8): 200 mg via ORAL
  Filled 2022-09-25 (×8): qty 2

## 2022-09-25 NOTE — Group Note (Signed)
LCSW Group Therapy Note  Group Date: 09/25/2022 Start Time: 1300 End Time: 1345   Type of Therapy and Topic:  Group Therapy - Healthy vs Unhealthy Coping Skills  Participation Level:  Active   Description of Group The focus of this group was to determine what unhealthy coping techniques typically are used by group members and what healthy coping techniques would be helpful in coping with various problems. Patients were guided in becoming aware of the differences between healthy and unhealthy coping techniques. Patients were asked to identify 2-3 healthy coping skills they would like to learn to use more effectively.  Therapeutic Goals Patients learned that coping is what human beings do all day long to deal with various situations in their lives Patients defined and discussed healthy vs unhealthy coping techniques Patients identified their preferred coping techniques and identified whether these were healthy or unhealthy Patients determined 2-3 healthy coping skills they would like to become more familiar with and use more often. Patients provided support and ideas to each other   Summary of Patient Progress:  Patient attended group. During group, Terrance Watkins expressed 3 words that described himself as: responsible, important, and professional. Patient proved open to input from peers and feedback from CSW. Patient demonstrated some insight into the subject matter, was respectful of peers, and participated throughout the entire session. Patient was at times distracted by internal stimuli and delusional thought patterns.   Therapeutic Modalities Cognitive Behavioral Therapy Motivational Interviewing  Terrance Watkins, Theresia Majors 09/25/2022  2:02 PM

## 2022-09-25 NOTE — Group Note (Signed)
Date:  09/25/2022 Time:  3:30 PM  Group Topic/Focus:  Activity Group:  Focus of the group is to promote physical and mental wellbeing by encouraging the patients to come outside to the courtyard to get some fresh air.    Participation Level:  Active  Participation Quality:  Appropriate  Affect:  Appropriate  Cognitive:  Appropriate  Insight: Appropriate  Engagement in Group:  Engaged  Modes of Intervention:  Activity  Additional Comments:    Mary Sella Umaiza Matusik 09/25/2022, 3:30 PM

## 2022-09-25 NOTE — Plan of Care (Signed)

## 2022-09-25 NOTE — Group Note (Signed)
Date:  09/25/2022 Time:  9:48 PM  Group Topic/Focus:  Making Healthy Choices:   The focus of this group is to help patients identify negative/unhealthy choices they were using prior to admission and identify positive/healthier coping strategies to replace them upon discharge.    Participation Level:  Active  Participation Quality:  Appropriate  Affect:  Appropriate  Cognitive:  Appropriate  Insight: Good  Engagement in Group:  Engaged  Modes of Intervention:  Discussion  Additional Comments:    Lenore Cordia 09/25/2022, 9:48 PM

## 2022-09-25 NOTE — Progress Notes (Addendum)
Nursing Shift Note:  1900-0700  Attended Evening Group: yes Medication Compliant:  yes Behavior: isolative, but cooperative Sleep Quality: broken. poor Significant Changes: none  The patient attended group, but spent the majority of the evening in his room.  He was pleasant upon contact with a stable affect.  Dreshon plans to request an increase in his sleep medications.  No unsafe behaviors noted overnight.
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
## 2022-09-25 NOTE — Progress Notes (Addendum)
[facility] MD Progress Note  09/25/2022 1:33 PM Terrance Watkins  MRN:  [id number]  Subjective:   Terrance Watkins is a 47 y.o. male with past medical history of schizophrenia who presents to the ED for psychiatric evaluation. Patient brought to the ED under IVC after his mother stated he has not been taking his medications and has been "arguing with people who are not there."   Client was very energetic with disorganized thought processes. He doubts his mother is his mother, "she has pictures of me but it doesn't mean she's my mother.  He sees the matrix in his home and is planted in his home.  Goes up my nose." He is not sleeping well and reported taking Trazodone 200 mg to help, dose increased.  High energy on assessment with paranoid delusions and hyperverbalization, pressured speech, lack of sleep, distractible, impulsivity, etc. Reported using a small amount of hemp, uses stingers (energy gels) regularly--gummies, no other drugs.  Toxicology screen is negative. Difficult to obtain specific information.  Principal Problem: Paranoid schizophrenia (HCC) Diagnosis: Principal Problem:   Paranoid schizophrenia (HCC)  Total Time spent with patient: 30 minutes  Past Psychiatric History: schizophrenia  Past Medical History:  Past Medical History:  Diagnosis Date   Paranoid schizophrenia (HCC)    History reviewed. No pertinent surgical history. Family History: History reviewed. No pertinent family history. Family Psychiatric  History: none Social History:  Social History   Substance and Sexual Activity  Alcohol Use Yes   Alcohol/week: 2.0 standard drinks of alcohol   Types: 2 Cans of beer per week     Social History   Substance and Sexual Activity  Drug Use Yes   Types: Marijuana    Social History   Socioeconomic History   Marital status: Single    Spouse name: Not on file   Number of children: Not on file   Years of education: Not on file   Highest education level: Not on file   Occupational History   Not on file  Tobacco Use   Smoking status: Every Day    Types: Cigarettes   Smokeless tobacco: Former  Advertising account planner   Vaping status: Some Days   Substances: CBD  Substance and Sexual Activity   Alcohol use: Yes    Alcohol/week: 2.0 standard drinks of alcohol    Types: 2 Cans of beer per week   Drug use: Yes    Types: Marijuana   Sexual activity: Yes  Other Topics Concern   Not on file  Social History Narrative   Not on file   Social Determinants of Health   Financial Resource Strain: Not on file  Food Insecurity: No Food Insecurity (09/23/2022)   Hunger Vital Sign    Worried About Running Out of Food in the Last Year: Never true    Ran Out of Food in the Last Year: Never true  Transportation Needs: No Transportation Needs (09/23/2022)   PRAPARE - Administrator, Civil Service (Medical): No    Lack of Transportation (Non-Medical): No  Physical Activity: Not on file  Stress: Not on file  Social Connections: Not on file   Additional Social History: lives with his mother    Sleep: Poor  Appetite:  Fair  Current Medications: Current Facility-Administered Medications  Medication Dose Route Frequency Provider Last Rate Last Admin   acetaminophen (TYLENOL) tablet 650 mg  650 mg Oral Q6H PRN Bennett, Christal H, NP       alum & mag  hydroxide-simeth (MAALOX/MYLANTA) 200-200-20 MG/5ML suspension 30 mL  30 mL Oral Q4H PRN Bennett, Christal H, NP       diphenhydrAMINE (BENADRYL) capsule 50 mg  50 mg Oral TID PRN Bennett, Christal H, NP       Or   diphenhydrAMINE (BENADRYL) injection 50 mg  50 mg Intramuscular TID PRN Bennett, Christal H, NP       fluPHENAZine (PROLIXIN) tablet 5 mg  5 mg Oral BID Lewanda Rife, MD   5 mg at 09/25/22 1610   haloperidol (HALDOL) tablet 5 mg  5 mg Oral TID PRN Bennett, Christal H, NP       Or   haloperidol lactate (HALDOL) injection 5 mg  5 mg Intramuscular TID PRN Bennett, Christal H, NP        hydrochlorothiazide (HYDRODIURIL) tablet 12.5 mg  12.5 mg Oral Daily Bennett, Christal H, NP   12.5 mg at 09/25/22 9604   hydrOXYzine (ATARAX) tablet 50 mg  50 mg Oral TID PRN Bennett, Christal H, NP       lisinopril (ZESTRIL) tablet 20 mg  20 mg Oral Daily Bennett, Christal H, NP   20 mg at 09/25/22 0833   LORazepam (ATIVAN) tablet 2 mg  2 mg Oral TID PRN Bennett, Christal H, NP       Or   LORazepam (ATIVAN) injection 2 mg  2 mg Intramuscular TID PRN Bennett, Christal H, NP       magnesium hydroxide (MILK OF MAGNESIA) suspension 30 mL  30 mL Oral Daily PRN Bennett, Christal H, NP       nicotine (NICODERM CQ - dosed in mg/24 hours) patch 14 mg  14 mg Transdermal Daily Lewanda Rife, MD       traZODone (DESYREL) tablet 100 mg  100 mg Oral QHS Lewanda Rife, MD   100 mg at 09/24/22 2140    Lab Results: No results found for this or any previous visit (from the past 48 hour(s)).  Blood Alcohol level:  Lab Results  Component Value Date   ETH <10 09/22/2022    Metabolic Disorder Labs: No results found for: "HGBA1C", "MPG" No results found for: "PROLACTIN" No results found for: "CHOL", "TRIG", "HDL", "CHOLHDL", "VLDL", "LDLCALC"   Musculoskeletal: Strength & Muscle Tone: within normal limits Gait & Station: normal Patient leans: N/A  Psychiatric Specialty Exam: Physical Exam Vitals and nursing note reviewed.  Constitutional:      Appearance: Normal appearance.  HENT:     Head: Normocephalic.     Nose: Nose normal.  Pulmonary:     Effort: Pulmonary effort is normal.  Musculoskeletal:        General: Normal range of motion.     Cervical back: Normal range of motion.  Neurological:     General: No focal deficit present.     Mental Status: He is alert and oriented to person, place, and time.  Psychiatric:        Attention and Perception: Attention normal. He perceives visual hallucinations.        Mood and Affect: Mood is anxious and elated.        Speech: Speech is  rapid and pressured.        Behavior: Behavior normal. Behavior is cooperative.        Thought Content: Thought content is paranoid and delusional.        Cognition and Memory: Cognition is impaired.        Judgment: Judgment is impulsive.     Review of  Systems  Psychiatric/Behavioral:  Positive for hallucinations. The patient is nervous/anxious.   All other systems reviewed and are negative.   Blood pressure (!) 161/83, pulse 79, temperature 97.9 F (36.6 C), temperature source Oral, resp. rate 18, height 5\' 11"  (1.803 m), weight 91.6 kg, SpO2 97%.Body mass index is 28.17 kg/m.  General Appearance: Casual  Eye Contact:  Fair  Speech:  Pressured  Volume:  Increased  Mood:  Anxious and Euphoric  Affect:  Congruent  Thought Process:  Disorganized  Orientation:  Full (Time, Place, and Person)  Thought Content:  Delusions, Hallucinations: Visual, and Paranoid Ideation  Suicidal Thoughts:  No  Homicidal Thoughts:  No  Memory:  Immediate;   Fair Recent;   Fair Remote;   Fair  Judgement:  Impaired  Insight:  Lacking  Psychomotor Activity:  Increased  Concentration:  Concentration: Poor and Attention Span: Poor  Recall:  [employer] of Knowledge:  Fair  Language:  Good  Akathisia:  No  Handed:  Right  AIMS (if indicated):     Assets:  Leisure Time Physical Health Resilience  ADL's:  Intact  Cognition:  Impaired,  Mild  Sleep:  Number of Hours: 5      Physical Exam: Physical Exam Vitals and nursing note reviewed.  Constitutional:      Appearance: Normal appearance.  HENT:     Head: Normocephalic.     Nose: Nose normal.  Pulmonary:     Effort: Pulmonary effort is normal.  Musculoskeletal:        General: Normal range of motion.     Cervical back: Normal range of motion.  Neurological:     General: No focal deficit present.     Mental Status: He is alert and oriented to person, place, and time.  Psychiatric:        Attention and Perception: Attention normal. He  perceives visual hallucinations.        Mood and Affect: Mood is anxious and elated.        Speech: Speech is rapid and pressured.        Behavior: Behavior normal. Behavior is cooperative.        Thought Content: Thought content is paranoid and delusional.        Cognition and Memory: Cognition is impaired.        Judgment: Judgment is impulsive.    Review of Systems  Psychiatric/Behavioral:  Positive for hallucinations. The patient is nervous/anxious.   All other systems reviewed and are negative.  Blood pressure (!) 161/83, pulse 79, temperature 97.9 F (36.6 C), temperature source Oral, resp. rate 18, height 5\' 11"  (1.803 m), weight 91.6 kg, SpO2 97%. Body mass index is 28.17 kg/m.   Treatment Plan Summary: Daily contact with patient to assess and evaluate symptoms and progress in treatment, Medication management, and Plan : Schizophrenia, paranoid type: Prolixin 5 mg BID Depakote 500 mg BID  Anxiety: Gabapentin 200 mg TID  Insomnia: Trazodone 100 mg at bedtime increased to 200 mg daily at bedtime.  LOS:  7 days or more  Nanine Means, NP 09/25/2022, 1:33 PM

## 2022-09-25 NOTE — Progress Notes (Signed)
D: Patient alert and oriented, able to make needs known. Denies SI/HI, does not directly answer whether he is having AVH at present. Denies pain at present. Patient goal today "corniation." Rates depression 1/10, hopelessness 0/10, and anxiety 0/10. Patient reports energy level as normal. He reports he slept fair last night. Patient does not request any PRN medication at this time.   A: Scheduled medications administered to patient per MD order. Support and encouragement provided. Routine safety checks conducted every fifteen minutes. Patient informed to notify staff with problems or concerns. Frequent verbal contact made.   R: No adverse drug reactions noted. Patient contracts for safety at this time. Patient is compliant with medications and treatment plan. Patient receptive, calm and cooperative. Patient interacts with others appropriately on unit at present. Patient remains safe at present.

## 2022-09-26 NOTE — Group Note (Signed)
Date:  09/26/2022 Time:  10:44 PM  Group Topic/Focus:  Wrap-Up Group:   The focus of this group is to help patients review their daily goal of treatment and discuss progress on daily workbooks.    Participation Level:  Active  Participation Quality:  Appropriate, Attentive, Sharing, and Supportive  Affect:  Appropriate  Cognitive:  Alert and Appropriate  Insight: Appropriate and Good  Engagement in Group:  Supportive  Modes of Intervention:  Activity and Support  Additional Comments:     Belva Crome 09/26/2022, 10:44 PM

## 2022-09-26 NOTE — Progress Notes (Addendum)
Nursing Shift Note:  1900-0700  Attended Evening Group: yes Medication Compliant: yes  Behavior: hypervigilant  Sleep Quality: broken Significant Changes: none  The patient remains cooperative and pleasant.  He continues to struggle with delusional thoughts, but to a lesser extent than when admitted.  Terrance Watkins intermittently socialized before falling asleep at 2300.  No safety issues to note.

## 2022-09-26 NOTE — Group Note (Signed)
Date:  09/26/2022 Time:  9:30 AM  Group Topic/Focus:  Goals Group:   The focus of this group is to help patients establish daily goals to achieve during treatment and discuss how the patient can incorporate goal setting into their daily lives to aide in recovery.  Community Group   Participation Level:  Did Not Attend  Luane Rochon A Benay Pomeroy 09/26/2022, 5:51 PM

## 2022-09-26 NOTE — Progress Notes (Signed)
[facility] MD Progress Note  09/26/2022  Terrance Watkins  MRN:  [id number]  Subjective:   Terrance Watkins is a 47 y.o. male with past medical history of schizophrenia who presents to the ED for psychiatric evaluation. Patient brought to the ED under IVC after his mother stated he has not been taking his medications and has been "arguing with people who are not there."  Patient today reports fine mood.He has been attending groups.  When asked about them some coping strategies he has learned in group patient said" I lost my a love gene, without love gene I can't do  anything, I cannot have sex My genes are filthy".  Then patient started talking about his mother and how he thinks his mother probably is not his mother.  It was difficult to redirect patient.  His thoughts are still disorganized with loose associations and flight of ideas.  He denies any intention to harm himself or others.  Denies auditory visual hallucinations.   Principal Problem: Paranoid schizophrenia (HCC) Diagnosis: Principal Problem:   Paranoid schizophrenia (HCC)   Past Psychiatric History: schizophrenia  Past Medical History:  Past Medical History:  Diagnosis Date   Paranoid schizophrenia (HCC)    History reviewed. No pertinent surgical history. Family History: History reviewed. No pertinent family history. Family Psychiatric  History: none Social History:  Social History   Substance and Sexual Activity  Alcohol Use Yes   Alcohol/week: 2.0 standard drinks of alcohol   Types: 2 Cans of beer per week     Social History   Substance and Sexual Activity  Drug Use Yes   Types: Marijuana    Social History   Socioeconomic History   Marital status: Single    Spouse name: Not on file   Number of children: Not on file   Years of education: Not on file   Highest education level: Not on file  Occupational History   Not on file  Tobacco Use   Smoking status: Every Day    Types: Cigarettes   Smokeless tobacco: Former   Advertising account planner   Vaping status: Some Days   Substances: CBD  Substance and Sexual Activity   Alcohol use: Yes    Alcohol/week: 2.0 standard drinks of alcohol    Types: 2 Cans of beer per week   Drug use: Yes    Types: Marijuana   Sexual activity: Yes  Other Topics Concern   Not on file  Social History Narrative   Not on file   Social Determinants of Health   Financial Resource Strain: Not on file  Food Insecurity: No Food Insecurity (09/23/2022)   Hunger Vital Sign    Worried About Running Out of Food in the Last Year: Never true    Ran Out of Food in the Last Year: Never true  Transportation Needs: No Transportation Needs (09/23/2022)   PRAPARE - Administrator, Civil Service (Medical): No    Lack of Transportation (Non-Medical): No  Physical Activity: Not on file  Stress: Not on file  Social Connections: Not on file   Additional Social History: lives with his mother    Sleep: Poor  Appetite:  Fair  Current Medications: Current Facility-Administered Medications  Medication Dose Route Frequency Provider Last Rate Last Admin   acetaminophen (TYLENOL) tablet 650 mg  650 mg Oral Q6H PRN Bennett, Christal H, NP       alum & mag hydroxide-simeth (MAALOX/MYLANTA) 200-200-20 MG/5ML suspension 30 mL  30 mL Oral Q4H  PRN Willeen Cass, Christal H, NP       diphenhydrAMINE (BENADRYL) capsule 50 mg  50 mg Oral TID PRN Bennett, Christal H, NP       Or   diphenhydrAMINE (BENADRYL) injection 50 mg  50 mg Intramuscular TID PRN Bennett, Christal H, NP       divalproex (DEPAKOTE) DR tablet 500 mg  500 mg Oral Q12H Charm Rings, NP   500 mg at 09/26/22 0745   fluPHENAZine (PROLIXIN) tablet 5 mg  5 mg Oral BID Lewanda Rife, MD   5 mg at 09/26/22 1704   gabapentin (NEURONTIN) capsule 200 mg  200 mg Oral TID Charm Rings, NP   200 mg at 09/25/22 1708   haloperidol (HALDOL) tablet 5 mg  5 mg Oral TID PRN Bennett, Christal H, NP       Or   haloperidol lactate (HALDOL) injection  5 mg  5 mg Intramuscular TID PRN Bennett, Christal H, NP       hydrochlorothiazide (HYDRODIURIL) tablet 12.5 mg  12.5 mg Oral Daily Bennett, Christal H, NP   12.5 mg at 09/26/22 0745   hydrOXYzine (ATARAX) tablet 50 mg  50 mg Oral TID PRN Bennett, Christal H, NP       lisinopril (ZESTRIL) tablet 20 mg  20 mg Oral Daily Bennett, Christal H, NP   20 mg at 09/26/22 0745   LORazepam (ATIVAN) tablet 2 mg  2 mg Oral TID PRN Bennett, Christal H, NP       Or   LORazepam (ATIVAN) injection 2 mg  2 mg Intramuscular TID PRN Bennett, Christal H, NP       magnesium hydroxide (MILK OF MAGNESIA) suspension 30 mL  30 mL Oral Daily PRN Bennett, Christal H, NP       nicotine (NICODERM CQ - dosed in mg/24 hours) patch 14 mg  14 mg Transdermal Daily Lewanda Rife, MD       traZODone (DESYREL) tablet 200 mg  200 mg Oral QHS Charm Rings, NP   200 mg at 09/25/22 2159    Lab Results:  Results for orders placed or performed during the hospital encounter of 09/23/22 (from the past 48 hour(s))  TSH     Status: None   Collection Time: 09/25/22  5:36 PM  Result Value Ref Range   TSH 1.755 0.350 - 4.500 uIU/mL    Comment: Performed by a 3rd Generation assay with a functional sensitivity of <=0.01 uIU/mL. Performed at [facility], [address] [place], [place] [address]   Lipid panel     Status: Abnormal   Collection Time: 09/25/22  5:36 PM  Result Value Ref Range   Cholesterol 162 0 - 200 mg/dL   Triglycerides 96 <604 mg/dL   HDL 40 (L) >54 mg/dL   Total CHOL/HDL Ratio 4.1 RATIO   VLDL 19 0 - 40 mg/dL   LDL Cholesterol 098 (H) 0 - 99 mg/dL    Comment:        Total Cholesterol/HDL:CHD Risk Coronary Heart Disease Risk Table                     Men   Women  1/2 Average Risk   3.4   3.3  Average Risk       5.0   4.4  2 X Average Risk   9.6   7.1  3 X Average Risk  23.4   11.0        Use the  calculated Patient Ratio above and the CHD Risk Table to determine the patient's CHD Risk.         ATP III CLASSIFICATION (LDL):  <100     mg/dL   Optimal  960-454  mg/dL   Near or Above                    Optimal  130-159  mg/dL   Borderline  098-119  mg/dL   High  >147     mg/dL   Very High Performed at [facility], [address] [place], [place] [address]     Blood Alcohol level:  Lab Results  Component Value Date   [facility] <10 09/22/2022    Metabolic Disorder Labs: No results found for: "HGBA1C", "MPG" No results found for: "PROLACTIN" Lab Results  Component Value Date   CHOL 162 09/25/2022   TRIG 96 09/25/2022   HDL 40 (L) 09/25/2022   CHOLHDL 4.1 09/25/2022   VLDL 19 09/25/2022   LDLCALC 103 (H) 09/25/2022     Musculoskeletal: Strength & Muscle Tone: within normal limits Gait & Station: normal Patient leans: N/A       Psychiatric Specialty Exam:   Presentation  General Appearance:  Appropriate for Environment; Casual   Eye Contact: Good   Speech: Clear and Coherent Hyperverbal   Speech Volume: Normal     Mood and Affect  Mood: Anxious   Affect: Hyperactive     Thought Process  Thought Processes: Disorganized with flight of ideas and loose associations   Past Diagnosis of Schizophrenia or Psychoactive disorder: Yes   Descriptions of Associations:Loose   Orientation:Full (Time, Place and Person)   Thought Content:Delusional paranoia   Hallucinations:Hallucinations: None   Ideas of Reference:None   Suicidal Thoughts:Suicidal Thoughts: No   Homicidal Thoughts:Homicidal Thoughts: No     Sensorium  Memory: Immediate Fair; Recent Fair   Judgment: Impaired   Insight: Lacking     Executive Functions  Concentration: Fair   Attention Span: Fair   Recall:No data recorded Fund of Knowledge: Fair   Language: Fair      Psychomotor Activity: Hyperactive     Assets  Assets: Manufacturing systems engineer; Housing; Social Support; Physical Health      Sleep: Sleep: Fair       Physical Exam: Physical  Exam Constitutional:      Appearance: Normal appearance.  HENT:     Head: Atraumatic.     Nose: Nose normal.  Eyes:     Pupils: Pupils are equal, round, and reactive to light.  Cardiovascular:     Rate and Rhythm: Normal rate.  Pulmonary:     Effort: Pulmonary effort is normal.  Skin:    General: Skin is warm and dry.  Neurological:     General: No focal deficit present.     Mental Status: He is alert.      Review of Systems  Constitutional: Negative.   HENT: Negative.    Eyes: Negative.   Respiratory: Negative.    Cardiovascular: Negative.   Gastrointestinal: Negative.   Genitourinary: Negative.   Skin: Negative.   Neurological: Negative.      Blood pressure 133/79, pulse (!) 59, temperature 97.7 F (36.5 C), temperature source Oral, resp. rate 18, height 5\' 11"  (1.803 m), weight 91.6 kg, SpO2 99%. Body mass index is 28.17 kg/m.   Treatment Plan Summary: Daily contact with patient to assess and evaluate symptoms and progress in treatment, Medication management, and Plan : Schizophrenia, paranoid type:  Prolixin 5 mg BID Depakote 500 mg BID  Anxiety: Gabapentin 200 mg TID  Insomnia: Trazodone 100 mg at bedtime increased to 200 mg daily at bedtime.  SW consult to get collateral and confirm due date of Risperdal Consta from outpatient provider.  LOS:  7 days or more  Lewanda Rife, MD

## 2022-09-26 NOTE — Plan of Care (Signed)
  Problem: Education: Goal: Knowledge of General Education information will improve Description: Including pain rating scale, medication(s)/side effects and non-pharmacologic comfort measures Outcome: Not Progressing   Problem: Health Behavior/Discharge Planning: Goal: Ability to manage health-related needs will improve Outcome: Not Progressing   Problem: Clinical Measurements: Goal: Ability to maintain clinical measurements within normal limits will improve Outcome: Progressing   Problem: Education: Goal: Knowledge of [facility] Education information/materials will improve Outcome: Not Progressing  Denies SI / HI / AVH. No signs of distress or injury. Pt is paranoid on presentation. Pt is questioning benefits of medications and wishes to discuss with provider today. Pt refused some scheduled medication. Staff will continue to monitor q15 for safety.

## 2022-09-27 DIAGNOSIS — F200 Paranoid schizophrenia: Secondary | ICD-10-CM | POA: Diagnosis not present

## 2022-09-27 LAB — HEMOGLOBIN A1C
Hgb A1c MFr Bld: 5.3 % (ref 4.8–5.6)
Mean Plasma Glucose: 105 mg/dL

## 2022-09-27 NOTE — Progress Notes (Addendum)
[facility] MD Progress Note  09/27/2022 12:55 PM Terrance Watkins  MRN:  [id number]  Subjective:   Terrance Watkins is a 47 y.o. male with past medical history of schizophrenia who presents to the ED for psychiatric evaluation. Patient brought to the ED under IVC after his mother stated he has not been taking his medications and has been "arguing with people who are not there."   Nurses notes, labs, vital signs, and team meeting reviewed prior to meeting with the client.  He is less disorganized on assessment, continues to ramble.  His concern is over his mother figure who he lives with bothering him, especially in the morning, and he is working on Pharmacologist to deter the effects of her actions on his feelings.  He does have a deck in the front and back of their place to help with distancing.  Depression is low with no suicidal ideations.  Anxiety is mild to moderate.  Appetite is "good", sleep is "really good" since his Trazodone was increased.  He does feel the Depakote caused him to have a low level of depression, no other side effects voiced.  Principal Problem: Paranoid schizophrenia (HCC) Diagnosis: Principal Problem:   Paranoid schizophrenia (HCC)  Total Time spent with patient: 30 minutes  Past Psychiatric History: schizophrenia  Past Medical History:  Past Medical History:  Diagnosis Date   Paranoid schizophrenia (HCC)    History reviewed. No pertinent surgical history. Family History: History reviewed. No pertinent family history. Family Psychiatric  History: none Social History:  Social History   Substance and Sexual Activity  Alcohol Use Yes   Alcohol/week: 2.0 standard drinks of alcohol   Types: 2 Cans of beer per week     Social History   Substance and Sexual Activity  Drug Use Yes   Types: Marijuana    Social History   Socioeconomic History   Marital status: Single    Spouse name: Not on file   Number of children: Not on file   Years of education: Not on file    Highest education level: Not on file  Occupational History   Not on file  Tobacco Use   Smoking status: Every Day    Types: Cigarettes   Smokeless tobacco: Former  Advertising account planner   Vaping status: Some Days   Substances: CBD  Substance and Sexual Activity   Alcohol use: Yes    Alcohol/week: 2.0 standard drinks of alcohol    Types: 2 Cans of beer per week   Drug use: Yes    Types: Marijuana   Sexual activity: Yes  Other Topics Concern   Not on file  Social History Narrative   Not on file   Social Determinants of Health   Financial Resource Strain: Not on file  Food Insecurity: No Food Insecurity (09/23/2022)   Hunger Vital Sign    Worried About Running Out of Food in the Last Year: Never true    Ran Out of Food in the Last Year: Never true  Transportation Needs: No Transportation Needs (09/23/2022)   PRAPARE - Administrator, Civil Service (Medical): No    Lack of Transportation (Non-Medical): No  Physical Activity: Not on file  Stress: Not on file  Social Connections: Not on file   Additional Social History: lives with his mother    Sleep: fair to good  Appetite:  Fair  Current Medications: Current Facility-Administered Medications  Medication Dose Route Frequency Provider Last Rate Last Admin   acetaminophen (  TYLENOL) tablet 650 mg  650 mg Oral Q6H PRN Bennett, Christal H, NP       alum & mag hydroxide-simeth (MAALOX/MYLANTA) 200-200-20 MG/5ML suspension 30 mL  30 mL Oral Q4H PRN Bennett, Christal H, NP       diphenhydrAMINE (BENADRYL) capsule 50 mg  50 mg Oral TID PRN Bennett, Christal H, NP       Or   diphenhydrAMINE (BENADRYL) injection 50 mg  50 mg Intramuscular TID PRN Bennett, Christal H, NP       divalproex (DEPAKOTE) DR tablet 500 mg  500 mg Oral Q12H Charm Rings, NP   500 mg at 09/27/22 0841   fluPHENAZine (PROLIXIN) tablet 5 mg  5 mg Oral BID Lewanda Rife, MD   5 mg at 09/27/22 0841   gabapentin (NEURONTIN) capsule 200 mg  200 mg Oral TID  Charm Rings, NP   200 mg at 09/27/22 0841   haloperidol (HALDOL) tablet 5 mg  5 mg Oral TID PRN Willeen Cass, Christal H, NP       Or   haloperidol lactate (HALDOL) injection 5 mg  5 mg Intramuscular TID PRN Bennett, Christal H, NP       hydrochlorothiazide (HYDRODIURIL) tablet 12.5 mg  12.5 mg Oral Daily Bennett, Christal H, NP   12.5 mg at 09/27/22 0843   hydrOXYzine (ATARAX) tablet 50 mg  50 mg Oral TID PRN Bennett, Christal H, NP       lisinopril (ZESTRIL) tablet 20 mg  20 mg Oral Daily Bennett, Christal H, NP   20 mg at 09/27/22 0841   LORazepam (ATIVAN) tablet 2 mg  2 mg Oral TID PRN Bennett, Christal H, NP       Or   LORazepam (ATIVAN) injection 2 mg  2 mg Intramuscular TID PRN Bennett, Christal H, NP       magnesium hydroxide (MILK OF MAGNESIA) suspension 30 mL  30 mL Oral Daily PRN Bennett, Christal H, NP       nicotine (NICODERM CQ - dosed in mg/24 hours) patch 14 mg  14 mg Transdermal Daily Lewanda Rife, MD       traZODone (DESYREL) tablet 200 mg  200 mg Oral QHS Charm Rings, NP   200 mg at 09/26/22 2138    Lab Results:  Results for orders placed or performed during the hospital encounter of 09/23/22 (from the past 48 hour(s))  TSH     Status: None   Collection Time: 09/25/22  5:36 PM  Result Value Ref Range   TSH 1.755 0.350 - 4.500 uIU/mL    Comment: Performed by a 3rd Generation assay with a functional sensitivity of <=0.01 uIU/mL. Performed at [facility], [address] [place], [place] [address]   Hemoglobin A1c     Status: None   Collection Time: 09/25/22  5:36 PM  Result Value Ref Range   Hgb A1c MFr Bld 5.3 4.8 - 5.6 %    Comment: (NOTE)         Prediabetes: 5.7 - 6.4         Diabetes: >6.4         Glycemic control for adults with diabetes: <7.0    Mean Plasma Glucose 105 mg/dL    Comment: (NOTE) Performed At: [facility] [address] [place], [place] [id number] Jolene Schimke MD [id number]   Lipid panel     Status: Abnormal    Collection Time: 09/25/22  5:36 PM  Result Value Ref Range  Cholesterol 162 0 - 200 mg/dL   Triglycerides 96 <086 mg/dL   HDL 40 (L) >57 mg/dL   Total CHOL/HDL Ratio 4.1 RATIO   VLDL 19 0 - 40 mg/dL   LDL Cholesterol 846 (H) 0 - 99 mg/dL    Comment:        Total Cholesterol/HDL:CHD Risk Coronary Heart Disease Risk Table                     Men   Women  1/2 Average Risk   3.4   3.3  Average Risk       5.0   4.4  2 X Average Risk   9.6   7.1  3 X Average Risk  23.4   11.0        Use the calculated Patient Ratio above and the CHD Risk Table to determine the patient's CHD Risk.        ATP III CLASSIFICATION (LDL):  <100     mg/dL   Optimal  962-952  mg/dL   Near or Above                    Optimal  130-159  mg/dL   Borderline  841-324  mg/dL   High  >401     mg/dL   Very High Performed at [facility], [address] [place], [place] [address]     Blood Alcohol level:  Lab Results  Component Value Date   [facility] <10 09/22/2022    Metabolic Disorder Labs: Lab Results  Component Value Date   HGBA1C 5.3 09/25/2022   MPG 105 09/25/2022   No results found for: "PROLACTIN" Lab Results  Component Value Date   CHOL 162 09/25/2022   TRIG 96 09/25/2022   HDL 40 (L) 09/25/2022   CHOLHDL 4.1 09/25/2022   VLDL 19 09/25/2022   LDLCALC 103 (H) 09/25/2022     Musculoskeletal: Strength & Muscle Tone: within normal limits Gait & Station: normal Patient leans: N/A  Psychiatric Specialty Exam: Physical Exam Vitals and nursing note reviewed.  Constitutional:      Appearance: Normal appearance.  HENT:     Head: Normocephalic.     Nose: Nose normal.  Pulmonary:     Effort: Pulmonary effort is normal.  Musculoskeletal:        General: Normal range of motion.     Cervical back: Normal range of motion.  Neurological:     General: No focal deficit present.     Mental Status: He is alert and oriented to person, place, and time.  Psychiatric:        Attention  and Perception: Attention and perception normal.        Mood and Affect: Mood is anxious.        Speech: Speech normal.        Behavior: Behavior normal. Behavior is cooperative.        Thought Content: Thought content is delusional.        Cognition and Memory: Cognition and memory normal.        Judgment: Judgment is impulsive.     Review of Systems  Psychiatric/Behavioral:  The patient is nervous/anxious.   All other systems reviewed and are negative.   Blood pressure (!) 151/76, pulse 76, temperature 97.7 F (36.5 C), temperature source Oral, resp. rate 18, height 5\' 11"  (1.803 m), weight 91.6 kg, SpO2 97%.Body mass index is 28.17 kg/m.  General Appearance: Casual  Eye Contact:  Fair  Speech:  WDL  Volume:  WDL  Mood:  Anxious  Affect:  Congruent  Thought Process:  Disorganized at times  Orientation:  Full (Time, Place, and Person)  Thought Content:  Occasional delusion  Suicidal Thoughts:  No  Homicidal Thoughts:  No  Memory:  Immediate;   Fair Recent;   Fair Remote;   Fair  Judgement:  Fair  Insight:  FAir  Psychomotor Activity:  WDL  Concentration:  FAir  Recall:  [employer] of Knowledge:  Fair  Language:  Good  Akathisia:  No  Handed:  Right  AIMS (if indicated):     Assets:  Leisure Time Physical Health Resilience  ADL's:  Intact  Cognition:  WDL  Sleep:  Number of Hours: 5      Physical Exam: Physical Exam Vitals and nursing note reviewed.  Constitutional:      Appearance: Normal appearance.  HENT:     Head: Normocephalic.     Nose: Nose normal.  Pulmonary:     Effort: Pulmonary effort is normal.  Musculoskeletal:        General: Normal range of motion.     Cervical back: Normal range of motion.  Neurological:     General: No focal deficit present.     Mental Status: He is alert and oriented to person, place, and time.  Psychiatric:        Attention and Perception: Attention and perception normal.        Mood and Affect: Mood is anxious.         Speech: Speech normal.        Behavior: Behavior normal. Behavior is cooperative.        Thought Content: Thought content is delusional.        Cognition and Memory: Cognition and memory normal.        Judgment: Judgment is impulsive.    Review of Systems  Psychiatric/Behavioral:  The patient is nervous/anxious.   All other systems reviewed and are negative.  Blood pressure (!) 151/76, pulse 76, temperature 97.7 F (36.5 C), temperature source Oral, resp. rate 18, height 5\' 11"  (1.803 m), weight 91.6 kg, SpO2 97%. Body mass index is 28.17 kg/m.   Treatment Plan Summary: Daily contact with patient to assess and evaluate symptoms and progress in treatment, Medication management, and Plan : Schizophrenia, paranoid type: Prolixin 5 mg BID Depakote 500 mg BID  Anxiety: Gabapentin 200 mg TID  Insomnia: Trazodone 200 mg daily at bedtime.  LOS: 3-5 days  Nanine Means, NP 09/27/2022, 12:55 PM

## 2022-09-27 NOTE — BH IP Treatment Plan (Signed)
Interdisciplinary Treatment and Diagnostic Plan Update  09/27/2022 Time of Session: 9:33AM WELLES WALTHALL MRN: [id number]  Principal Diagnosis: Paranoid schizophrenia [facility])  Secondary Diagnoses: Principal Problem:   Paranoid schizophrenia (HCC)   Current Medications:  Current Facility-Administered Medications  Medication Dose Route Frequency Provider Last Rate Last Admin   acetaminophen (TYLENOL) tablet 650 mg  650 mg Oral Q6H PRN Bennett, Christal H, NP       alum & mag hydroxide-simeth (MAALOX/MYLANTA) 200-200-20 MG/5ML suspension 30 mL  30 mL Oral Q4H PRN Bennett, Christal H, NP       diphenhydrAMINE (BENADRYL) capsule 50 mg  50 mg Oral TID PRN Bennett, Christal H, NP       Or   diphenhydrAMINE (BENADRYL) injection 50 mg  50 mg Intramuscular TID PRN Bennett, Christal H, NP       divalproex (DEPAKOTE) DR tablet 500 mg  500 mg Oral Q12H Charm Rings, NP   500 mg at 09/27/22 0841   fluPHENAZine (PROLIXIN) tablet 5 mg  5 mg Oral BID Lewanda Rife, MD   5 mg at 09/27/22 0841   gabapentin (NEURONTIN) capsule 200 mg  200 mg Oral TID Charm Rings, NP   200 mg at 09/27/22 0841   haloperidol (HALDOL) tablet 5 mg  5 mg Oral TID PRN Bennett, Christal H, NP       Or   haloperidol lactate (HALDOL) injection 5 mg  5 mg Intramuscular TID PRN Bennett, Christal H, NP       hydrochlorothiazide (HYDRODIURIL) tablet 12.5 mg  12.5 mg Oral Daily Bennett, Christal H, NP   12.5 mg at 09/27/22 0843   hydrOXYzine (ATARAX) tablet 50 mg  50 mg Oral TID PRN Bennett, Christal H, NP       lisinopril (ZESTRIL) tablet 20 mg  20 mg Oral Daily Bennett, Christal H, NP   20 mg at 09/27/22 0841   LORazepam (ATIVAN) tablet 2 mg  2 mg Oral TID PRN Bennett, Christal H, NP       Or   LORazepam (ATIVAN) injection 2 mg  2 mg Intramuscular TID PRN Bennett, Christal H, NP       magnesium hydroxide (MILK OF MAGNESIA) suspension 30 mL  30 mL Oral Daily PRN Bennett, Christal H, NP       nicotine (NICODERM CQ - dosed in  mg/24 hours) patch 14 mg  14 mg Transdermal Daily Lewanda Rife, MD       traZODone (DESYREL) tablet 200 mg  200 mg Oral QHS Charm Rings, NP   200 mg at 09/26/22 2138   PTA Medications: Medications Prior to Admission  Medication Sig Dispense Refill Last Dose   fluPHENAZine (PROLIXIN) 10 MG tablet Take 10 mg by mouth 3 (three) times daily.      RISPERDAL CONSTA 25 MG injection Inject 25 mg into the muscle every 14 (fourteen) days.      RISPERDAL CONSTA 50 MG injection Inject 50 mg into the muscle every 14 (fourteen) days.      traZODone (DESYREL) 100 MG tablet Take 200 mg by mouth at bedtime as needed.       Patient Stressors: Other: Delusional, paranoid    Patient Strengths: Supportive family/friends   Treatment Modalities: Medication Management, Group therapy, Case management,  1 to 1 session with clinician, Psychoeducation, Recreational therapy.   Physician Treatment Plan for Primary Diagnosis: Paranoid schizophrenia (HCC) Long Term Goal(s): Improvement in symptoms so as ready for discharge   Short Term Goals: Ability to  identify changes in lifestyle to reduce recurrence of condition will improve Ability to verbalize feelings will improve Ability to disclose and discuss suicidal ideas Ability to demonstrate self-control will improve Ability to identify and develop effective coping behaviors will improve Compliance with prescribed medications will improve Ability to identify triggers associated with substance abuse/mental health issues will improve  Medication Management: Evaluate patient's response, side effects, and tolerance of medication regimen.  Therapeutic Interventions: 1 to 1 sessions, Unit Group sessions and Medication administration.  Evaluation of Outcomes: Progressing  Physician Treatment Plan for Secondary Diagnosis: Principal Problem:   Paranoid schizophrenia (HCC)  Long Term Goal(s): Improvement in symptoms so as ready for discharge   Short Term  Goals: Ability to identify changes in lifestyle to reduce recurrence of condition will improve Ability to verbalize feelings will improve Ability to disclose and discuss suicidal ideas Ability to demonstrate self-control will improve Ability to identify and develop effective coping behaviors will improve Compliance with prescribed medications will improve Ability to identify triggers associated with substance abuse/mental health issues will improve     Medication Management: Evaluate patient's response, side effects, and tolerance of medication regimen.  Therapeutic Interventions: 1 to 1 sessions, Unit Group sessions and Medication administration.  Evaluation of Outcomes: Progressing   RN Treatment Plan for Primary Diagnosis: Paranoid schizophrenia (HCC) Long Term Goal(s): Knowledge of disease and therapeutic regimen to maintain health will improve  Short Term Goals: Ability to demonstrate self-control, Ability to participate in decision making will improve, Ability to verbalize feelings will improve, Ability to disclose and discuss suicidal ideas, Ability to identify and develop effective coping behaviors will improve, and Compliance with prescribed medications will improve  Medication Management: RN will administer medications as ordered by provider, will assess and evaluate patient's response and provide education to patient for prescribed medication. RN will report any adverse and/or side effects to prescribing provider.  Therapeutic Interventions: 1 on 1 counseling sessions, Psychoeducation, Medication administration, Evaluate responses to treatment, Monitor vital signs and CBGs as ordered, Perform/monitor CIWA, COWS, AIMS and Fall Risk screenings as ordered, Perform wound care treatments as ordered.  Evaluation of Outcomes: Progressing   LCSW Treatment Plan for Primary Diagnosis: Paranoid schizophrenia (HCC) Long Term Goal(s): Safe transition to appropriate next level of care at  discharge, Engage patient in therapeutic group addressing interpersonal concerns.  Short Term Goals: Engage patient in aftercare planning with referrals and resources, Increase social support, Increase ability to appropriately verbalize feelings, Increase emotional regulation, Facilitate acceptance of mental health diagnosis and concerns, and Increase skills for wellness and recovery  Therapeutic Interventions: Assess for all discharge needs, 1 to 1 time with Social worker, Explore available resources and support systems, Assess for adequacy in community support network, Educate family and significant other(s) on suicide prevention, Complete Psychosocial Assessment, Interpersonal group therapy.  Evaluation of Outcomes: Progressing   Progress in Treatment: Attending groups: Yes. Participating in groups: Yes. Taking medication as prescribed: Yes. Toleration medication: Yes. Family/Significant other contact made: Yes, individual(s) contacted:  SPE completed with the patient's mother.  Patient understands diagnosis: No. Discussing patient identified problems/goals with staff: Yes. Medical problems stabilized or resolved: Yes. Denies suicidal/homicidal ideation: Yes. Issues/concerns per patient self-inventory: No. Other: none  New problem(s) identified: No, Describe:  none  New Short Term/Long Term Goal(s): elimination of symptoms of psychosis, medication management for mood stabilization; elimination of SI thoughts; development of comprehensive mental wellness/sobriety plan.   Patient Goals:  "find ways to learn to I wont get upset. I can walk away  and get upset"  Discharge Plan or Barriers: CSW to assist in the development of appropriate discharge plans.   Reason for Continuation of Hospitalization: Anxiety Depression Hallucinations Medication stabilization Suicidal ideation  Estimated Length of Stay:  1-7 days  Last 3 [place] Suicide Severity Risk Score: Flowsheet Row Admission  (Current) from 09/23/2022 in [facility] INPATIENT BEH[facility]IORAL MEDICINE ED from 09/22/2022 in [facility] Emergency Department at [facility]  C-SSRS RISK CATEGORY No Risk No Risk       Last PHQ 2/9 Scores:     No data to display          Scribe for Treatment Team: AVEDIS BEVIS, LCSW 09/27/2022 12:02 PM

## 2022-09-27 NOTE — Plan of Care (Signed)
?  Problem: Education: ?Goal: Knowledge of General Education information will improve ?Description: Including pain rating scale, medication(s)/side effects and non-pharmacologic comfort measures ?Outcome: Progressing ?  ?Problem: Coping: ?Goal: Level of anxiety will decrease ?Outcome: Progressing ?  ?Problem: Nutrition: ?Goal: Adequate nutrition will be maintained ?Outcome: Progressing ?  ?Problem: Safety: ?Goal: Ability to remain free from injury will improve ?Outcome: Progressing ?  ?

## 2022-09-27 NOTE — Progress Notes (Signed)
   09/27/22 1100  Psych Admission Type (Psych Patients Only)  Admission Status Involuntary  Psychosocial Assessment  Patient Complaints Anxiety  Eye Contact Brief  Facial Expression Flat  Affect Appropriate to circumstance  Speech Soft  Interaction Guarded  Motor Activity Slow  Appearance/Hygiene In scrubs  Behavior Characteristics Appropriate to situation  Mood Suspicious  Thought Process  Coherency Disorganized  Content Delusions  Delusions Paranoid  Perception Derealization  Hallucination None reported or observed  Judgment Poor  Confusion Mild  Danger to Self  Current suicidal ideation? Denies  Danger to Others  Danger to Others None reported or observed  Danger to Others Abnormal  Harmful Behavior to others No threats or harm toward other people   Patient attended all groups. Refused Gabapentin. States he does not need it but received previous doses. Denies HI/AVH

## 2022-09-27 NOTE — Group Note (Signed)
[facility] LCSW Group Therapy Note    Group Date: 09/27/2022 Start Time: 1329 End Time: 1420  Type of Therapy and Topic:  Group Therapy:  Overcoming Obstacles  Participation Level:  [facility] PARTICIPATION LEVEL: Active   Description of Group:   In this group patients will be encouraged to explore what they see as obstacles to their own wellness and recovery. They will be guided to discuss their thoughts, feelings, and behaviors related to these obstacles. The group will process together ways to cope with barriers, with attention given to specific choices patients can make. Each patient will be challenged to identify changes they are motivated to make in order to overcome their obstacles. This group will be process-oriented, with patients participating in exploration of their own experiences as well as giving and receiving support and challenge from other group members.  Therapeutic Goals: 1. Patient will identify personal and current obstacles as they relate to admission. 2. Patient will identify barriers that currently interfere with their wellness or overcoming obstacles.  3. Patient will identify feelings, thought process and behaviors related to these barriers. 4. Patient will identify two changes they are willing to make to overcome these obstacles:    Summary of Patient Progress Patient was present for the entirety of the group process. He identified his personal obstacle as his relationship with his payee/guardian. Pt shared that he struggles with her nagging him. He stated that her habits are bothersome. Pt endorsed that the only time that he feels like he is supported in within the hospital system. Pt shared that he needs to learn to recognize the warning signs with her so that he knows when to walk away from the situation so that he does not say something that he will end up regretting later. Proper nutrition was identified as something that he  tries to maintain to remain healthy and mentally stable. Pt rambles at length and at times not on topic. Pt lacks insight into the topic. However, he did appear open and receptive to feedback/comments from both peers and facilitator.    Therapeutic Modalities:   Cognitive Behavioral Therapy Solution Focused Therapy Motivational Interviewing Relapse Prevention Therapy   Glenis Smoker, LCSW

## 2022-09-27 NOTE — Group Note (Signed)
Recreation Therapy Group Note   Group Topic:Health and Wellness  Group Date: 09/27/2022 Start Time: 1000 End Time: 1035 Facilitators: Rosina Lowenstein, LRT, CTRS Location:  Courtyard  Group Description: [employer]. LRT and patients played games of basketball and drew with chalk while outside in the courtyard while getting fresh air and sunlight. Music was being played in the background. LRT and peers conversed about different games they have played before. LRT encouraged pts to drink water after being outside, sweating and getting their heart rate up.  Goal Area(s) Addressed: Patient will build on frustration tolerance skills. Patients will partake in a competitive play game with peers. Patients will gain knowledge of new leisure interest/hobby.   Affect/Mood: Appropriate   Participation Level: Active and Engaged   Participation Quality: Independent   Behavior: Calm and Cooperative   Speech/Thought Process: Flight of ideas and Loose association   Insight: Fair   Judgement: Fair    Modes of Intervention: Activity and Open Conversation   Patient Response to Interventions:  Attentive and Interested    Education Outcome:  Acknowledges education   Clinical Observations/Individualized Feedback: Altan was active in their participation of session activities and group discussion. Pt identified "I am not sure if they want me to get care here. I need to have love. I use to have fun and it got me in trouble and it cost a lot of money." Pts thoughts are all over the place, however he is very pleasant when interacting with LRT and peers.    Plan: Continue to engage patient in RT group sessions 2-3x/week.   Rosina Lowenstein, LRT, CTRS 09/27/2022 10:47 AM

## 2022-09-27 NOTE — Progress Notes (Signed)
Patient is pleasant and cooperative. Disorganized but has shown improvement. Requesting trazodone for sleep. States it helps his mom and he is hoping it helps him as well. Isolative to self and room. Denies SI, HI, AVH Encouragement and support provided. Safety checks maintained. Medications given as prescribed. Pt receptive and remains safe on unit with q 15 min checks.

## 2022-09-27 NOTE — Group Note (Signed)
Date:  09/27/2022 Time:  9:35 PM  Group Topic/Focus:  Building Self Esteem:   The Focus of this group is helping patients become aware of the effects of self-esteem on their lives, the things they and others do that enhance or undermine their self-esteem, seeing the relationship between their level of self-esteem and the choices they make and learning ways to enhance self-esteem. Goals Group:   The focus of this group is to help patients establish daily goals to achieve during treatment and discuss how the patient can incorporate goal setting into their daily lives to aide in recovery. Self Care:   The focus of this group is to help patients understand the importance of self-care in order to improve or restore emotional, physical, spiritual, interpersonal, and financial health. Wrap-Up Group:   The focus of this group is to help patients review their daily goal of treatment and discuss progress on daily workbooks.    Participation Level:  Active  Participation Quality:  Appropriate  Affect:  Appropriate  Cognitive:  Appropriate  Insight: Appropriate  Engagement in Group:  Engaged  Modes of Intervention:  Discussion  Additional Comments:    Doug Sou 09/27/2022, 9:35 PM

## 2022-09-27 NOTE — Group Note (Signed)
Date:  09/27/2022 Time:  6:26 PM  Group Topic/Focus:  Outdoor recreation    Participation Level:  Active  Participation Quality:  Appropriate, Attentive, and Sharing  Affect:  Excited  Cognitive:  Alert, Appropriate, and Oriented  Insight: Appropriate  Engagement in Group:  Engaged  Modes of Intervention:  Activity, Discussion, and Socialization  Additional Comments:    Rosaura Carpenter 09/27/2022, 6:26 PM

## 2022-09-28 DIAGNOSIS — F200 Paranoid schizophrenia: Secondary | ICD-10-CM | POA: Diagnosis not present

## 2022-09-28 LAB — VALPROIC ACID LEVEL: Valproic Acid Lvl: 58 ug/mL (ref 50.0–100.0)

## 2022-09-28 NOTE — Group Note (Deleted)
Date:  09/28/2022 Time:  1:54 PM  Group Topic/Focus:        Participation Level:  {BHH PARTICIPATION WUJWJ:19147}  Participation Quality:  {BHH PARTICIPATION QUALITY:22265}  Affect:  {BHH AFFECT:22266}  Cognitive:  {BHH COGNITIVE:22267}  Insight: {BHH Insight2:20797}  Engagement in Group:  {BHH ENGAGEMENT IN GROUP:22268}  Modes of Intervention:  {BHH MODES OF INTERVENTION:22269}  Additional Comments:  ***  Ibrohim Simmers 09/28/2022, 1:54 PM

## 2022-09-28 NOTE — Group Note (Signed)
Recreation Therapy Group Note   Group Topic:Healthy Support Systems  Group Date: 09/28/2022 Start Time: 1000 End Time: 1100 Facilitators: Rosina Lowenstein, LRT, CTRS Location:  Craft Room  Group Description: Straw Bridge.  Patients were given 10 plastic drinking straws and an equal length of masking tape. Using the materials provided, patients were instructed to build a free-standing bridge-like structure to suspend an everyday item (ex: deck of cards) off the floor or table surface. All materials were required to be used in Secondary school teacher. LRT facilitated post-activity discussion reviewing the importance of having strong and healthy support systems in our lives. LRT discussed how the people in our lives serve as the tape and the deck of cards we placed on top of our straw structure are the stressors we face in daily life. LRT and pts discussed what happens in our life when things get too heavy for [place], and we don't have strong supports outside of the hospital. Pt shared 2 of their healthy supports aloud in the group.   Goal Area(s) Addressed:  Patient will identify 2 healthy supports in their life. Patient will identify skills to successfully complete activity. Patient will identify correlation of this activity to life post-discharge.  Patient will work on Product manager.    Affect/Mood: Appropriate   Participation Level: Active and Engaged   Participation Quality: Independent   Behavior: Appropriate, Calm, and Cooperative   Speech/Thought Process: Flight of ideas and Loose association   Insight: Fair   Judgement: Fair    Modes of Intervention: Activity   Patient Response to Interventions:  Attentive, Engaged, Interested , and Receptive   Education Outcome:  Acknowledges education   Clinical Observations/Individualized Feedback: Terrance Watkins was active in their participation of session activities and group discussion. Pt identified "my supports woulf provide me with  care, and when I say care I mean transportation." Pt was pleasant, however gets to talking about a topic and needs to be redirected to the topic that is originally asked of him or redirection to the group so that he does not keep talking.    Plan: Continue to engage patient in RT group sessions 2-3x/week.   Rosina Lowenstein, LRT, CTRS 09/28/2022 11:23 AM

## 2022-09-28 NOTE — Progress Notes (Signed)
D- Patient alert and oriented x 2-3. Very disorganized with bizarre speech. Affect anxious and blunted/mood disorganized and agitated about his medication regimen. Denies SI/ HI/ AVH but seems to be responding to external stimuli at times Patient denies pain at present. Patient endorses anxiety. Refuses Gabapentin. States "I really don't think I need it". On his patient self inventory he complains of "consumption teutor" also writes in  for discharge the word "[place]". Goal was clear- he wrote "control, relax, curious, sometimes to be alone". Also "to make the final decision' and "future of obligation".  A- Scheduled medications administered to patient, per MD orders except forr Gabapentin which he refuses. Support and encouragement provided.  Routine safety checks conducted every 15 minutes without incident.  Patient informed to notify staff with problems or concerns and verbalizes understanding. R- No adverse drug reactions noted.  Patient compliant with medications and treatment plan. Patient calm and cooperative cooperative spending some time in his room, pacing and going to day room.  Patient remains safe on the unit at this time.

## 2022-09-28 NOTE — Group Note (Signed)
Date:  09/28/2022 Time:  11:12 AM  Group Topic/Focus:  Goals Group:   The focus of this group is to help patients establish daily goals to achieve during treatment and discuss how the patient can incorporate goal setting into their daily lives to aide in recovery.    Participation Level:  Active  Participation Quality:  Appropriate  Affect:  Appropriate  Cognitive:  Appropriate  Insight: Appropriate  Engagement in Group:  Engaged  Modes of Intervention:  Discussion, Education, and Support  Additional Comments:    Wilford Corner 09/28/2022, 11:12 AM

## 2022-09-28 NOTE — Plan of Care (Signed)
Problem: Education: Goal: Knowledge of General Education information will improve Description: Including pain rating scale, medication(s)/side effects and non-pharmacologic comfort measures Outcome: Not Progressing   Problem: Health Behavior/Discharge Planning: Goal: Ability to manage health-related needs will improve Outcome: Not Progressing   Problem: Clinical Measurements: Goal: Ability to maintain clinical measurements within normal limits will improve Outcome: Not Progressing Goal: Will remain free from infection Outcome: Not Progressing Goal: Diagnostic test results will improve Outcome: Not Progressing Goal: Respiratory complications will improve Outcome: Not Progressing Goal: Cardiovascular complication will be avoided Outcome: Not Progressing   Problem: Activity: Goal: Risk for activity intolerance will decrease Outcome: Not Progressing   Problem: Nutrition: Goal: Adequate nutrition will be maintained Outcome: Not Progressing   Problem: Coping: Goal: Level of anxiety will decrease Outcome: Not Progressing   Problem: Elimination: Goal: Will not experience complications related to bowel motility Outcome: Not Progressing Goal: Will not experience complications related to urinary retention Outcome: Not Progressing   Problem: Pain Managment: Goal: General experience of comfort will improve Outcome: Not Progressing   Problem: Safety: Goal: Ability to remain free from injury will improve Outcome: Not Progressing   Problem: Skin Integrity: Goal: Risk for impaired skin integrity will decrease Outcome: Not Progressing   Problem: Education: Goal: Knowledge of [place] [facility] Education information/materials will improve Outcome: Not Progressing Goal: Emotional status will improve Outcome: Not Progressing Goal: Mental status will improve Outcome: Not Progressing Goal: Verbalization of understanding the information provided will improve Outcome: Not  Progressing   Problem: Activity: Goal: Interest or engagement in activities will improve Outcome: Not Progressing Goal: Sleeping patterns will improve Outcome: Not Progressing   Problem: Coping: Goal: Ability to verbalize frustrations and anger appropriately will improve Outcome: Not Progressing Goal: Ability to demonstrate self-control will improve Outcome: Not Progressing   Problem: Health Behavior/Discharge Planning: Goal: Identification of resources available to assist in meeting health care needs will improve Outcome: Not Progressing Goal: Compliance with treatment plan for underlying cause of condition will improve Outcome: Not Progressing   Problem: Physical Regulation: Goal: Ability to maintain clinical measurements within normal limits will improve Outcome: Not Progressing   Problem: Safety: Goal: Periods of time without injury will increase Outcome: Not Progressing   Problem: Education: Goal: Utilization of techniques to improve thought processes will improve Outcome: Not Progressing Goal: Knowledge of the prescribed therapeutic regimen will improve Outcome: Not Progressing   Problem: Activity: Goal: Interest or engagement in leisure activities will improve Outcome: Not Progressing Goal: Imbalance in normal sleep/wake cycle will improve Outcome: Not Progressing   Problem: Coping: Goal: Coping ability will improve Outcome: Not Progressing Goal: Will verbalize feelings Outcome: Not Progressing   Problem: Health Behavior/Discharge Planning: Goal: Ability to make decisions will improve Outcome: Not Progressing Goal: Compliance with therapeutic regimen will improve Outcome: Not Progressing   Problem: Role Relationship: Goal: Will demonstrate positive changes in social behaviors and relationships Outcome: Not Progressing   Problem: Safety: Goal: Ability to disclose and discuss suicidal ideas will improve Outcome: Not Progressing Goal: Ability to identify  and utilize support systems that promote safety will improve Outcome: Not Progressing   Problem: Self-Concept: Goal: Will verbalize positive feelings about self Outcome: Not Progressing Goal: Level of anxiety will decrease Outcome: Not Progressing   Problem: Activity: Goal: Will verbalize the importance of balancing activity with adequate rest periods Outcome: Not Progressing   Problem: Education: Goal: Will be free of psychotic symptoms Outcome: Not Progressing Goal: Knowledge of the prescribed therapeutic regimen will improve Outcome: Not Progressing  Problem: Coping: Goal: Coping ability will improve Outcome: Not Progressing Goal: Will verbalize feelings Outcome: Not Progressing   Problem: Health Behavior/Discharge Planning: Goal: Compliance with prescribed medication regimen will improve Outcome: Not Progressing   Problem: Nutritional: Goal: Ability to achieve adequate nutritional intake will improve Outcome: Not Progressing

## 2022-09-28 NOTE — Group Note (Signed)
Date:  09/28/2022 Time:  6:11 PM  Group Topic/Focus:  Activity Group    Participation Level:  Did Not Attend   Terrance Watkins Terrance Watkins 09/28/2022, 6:11 PM

## 2022-09-28 NOTE — Progress Notes (Signed)
[facility] MD Progress Note  09/28/2022 1:13 PM Terrance Watkins  MRN:  [id number]  Subjective:   Terrance Watkins is a 47 y.o. male with past medical history of schizophrenia who presents to the ED for psychiatric evaluation. Patient brought to the ED under IVC after his mother stated he has not been taking his medications and has been "arguing with people who are not there."   Nurses notes, labs, vital signs, and team meeting reviewed prior to meeting with the client.  He is less disorganized on assessment, continues to ramble.  His symptoms are improving but continues to ruminate about why he is here, " I think it's about other people and not me", no insight when his symptoms were discussed.  "I'm just confused".  He states having an increase in energy with the Depakote which is not the case on observation.  His depression is "mild", no suicidal ideations. He is easier to redirect on assessment.  He is "now getting enough sleep" with his Trazodone increase. Appetite is "good".  He stated, "All treatments are a realization" along with other odd comments.  Principal Problem: Paranoid schizophrenia (HCC) Diagnosis: Principal Problem:   Paranoid schizophrenia (HCC)  Total Time spent with patient: 30 minutes  Past Psychiatric History: schizophrenia  Past Medical History:  Past Medical History:  Diagnosis Date   Paranoid schizophrenia (HCC)    History reviewed. No pertinent surgical history. Family History: History reviewed. No pertinent family history. Family Psychiatric  History: none Social History:  Social History   Substance and Sexual Activity  Alcohol Use Yes   Alcohol/week: 2.0 standard drinks of alcohol   Types: 2 Cans of beer per week     Social History   Substance and Sexual Activity  Drug Use Yes   Types: Marijuana    Social History   Socioeconomic History   Marital status: Single    Spouse name: Not on file   Number of children: Not on file   Years of education: Not on file    Highest education level: Not on file  Occupational History   Not on file  Tobacco Use   Smoking status: Every Day    Types: Cigarettes   Smokeless tobacco: Former  Advertising account planner   Vaping status: Some Days   Substances: CBD  Substance and Sexual Activity   Alcohol use: Yes    Alcohol/week: 2.0 standard drinks of alcohol    Types: 2 Cans of beer per week   Drug use: Yes    Types: Marijuana   Sexual activity: Yes  Other Topics Concern   Not on file  Social History Narrative   Not on file   Social Determinants of Health   Financial Resource Strain: Not on file  Food Insecurity: No Food Insecurity (09/23/2022)   Hunger Vital Sign    Worried About Running Out of Food in the Last Year: Never true    Ran Out of Food in the Last Year: Never true  Transportation Needs: No Transportation Needs (09/23/2022)   PRAPARE - Administrator, Civil Service (Medical): No    Lack of Transportation (Non-Medical): No  Physical Activity: Not on file  Stress: Not on file  Social Connections: Not on file   Additional Social History: lives with his mother    Sleep: fair to good  Appetite:  Fair  Current Medications: Current Facility-Administered Medications  Medication Dose Route Frequency Provider Last Rate Last Admin   acetaminophen (TYLENOL) tablet 650 mg  650 mg Oral Q6H PRN Bennett, Christal H, NP       alum & mag hydroxide-simeth (MAALOX/MYLANTA) 200-200-20 MG/5ML suspension 30 mL  30 mL Oral Q4H PRN Bennett, Christal H, NP       diphenhydrAMINE (BENADRYL) capsule 50 mg  50 mg Oral TID PRN Bennett, Christal H, NP       Or   diphenhydrAMINE (BENADRYL) injection 50 mg  50 mg Intramuscular TID PRN Bennett, Christal H, NP       divalproex (DEPAKOTE) DR tablet 500 mg  500 mg Oral Q12H Charm Rings, NP   500 mg at 09/28/22 0900   fluPHENAZine (PROLIXIN) tablet 5 mg  5 mg Oral BID Lewanda Rife, MD   5 mg at 09/28/22 0901   gabapentin (NEURONTIN) capsule 200 mg  200 mg Oral  TID Charm Rings, NP   200 mg at 09/28/22 0900   haloperidol (HALDOL) tablet 5 mg  5 mg Oral TID PRN Bennett, Christal H, NP       Or   haloperidol lactate (HALDOL) injection 5 mg  5 mg Intramuscular TID PRN Bennett, Christal H, NP       hydrochlorothiazide (HYDRODIURIL) tablet 12.5 mg  12.5 mg Oral Daily Bennett, Christal H, NP   12.5 mg at 09/28/22 1004   hydrOXYzine (ATARAX) tablet 50 mg  50 mg Oral TID PRN Bennett, Christal H, NP       lisinopril (ZESTRIL) tablet 20 mg  20 mg Oral Daily Bennett, Christal H, NP   20 mg at 09/28/22 0901   LORazepam (ATIVAN) tablet 2 mg  2 mg Oral TID PRN Bennett, Christal H, NP       Or   LORazepam (ATIVAN) injection 2 mg  2 mg Intramuscular TID PRN Bennett, Christal H, NP       magnesium hydroxide (MILK OF MAGNESIA) suspension 30 mL  30 mL Oral Daily PRN Bennett, Christal H, NP       nicotine (NICODERM CQ - dosed in mg/24 hours) patch 14 mg  14 mg Transdermal Daily Lewanda Rife, MD       traZODone (DESYREL) tablet 200 mg  200 mg Oral QHS Charm Rings, NP   200 mg at 09/27/22 2125    Lab Results:  No results found for this or any previous visit (from the past 48 hour(s)).   Blood Alcohol level:  Lab Results  Component Value Date   ETH <10 09/22/2022    Metabolic Disorder Labs: Lab Results  Component Value Date   HGBA1C 5.3 09/25/2022   MPG 105 09/25/2022   No results found for: "PROLACTIN" Lab Results  Component Value Date   CHOL 162 09/25/2022   TRIG 96 09/25/2022   HDL 40 (L) 09/25/2022   CHOLHDL 4.1 09/25/2022   VLDL 19 09/25/2022   LDLCALC 103 (H) 09/25/2022     Musculoskeletal: Strength & Muscle Tone: within normal limits Gait & Station: normal Patient leans: N/A  Psychiatric Specialty Exam: Physical Exam Vitals and nursing note reviewed.  Constitutional:      Appearance: Normal appearance.  HENT:     Head: Normocephalic.     Nose: Nose normal.  Pulmonary:     Effort: Pulmonary effort is normal.   Musculoskeletal:        General: Normal range of motion.     Cervical back: Normal range of motion.  Neurological:     General: No focal deficit present.     Mental Status: He is alert and  oriented to person, place, and time.  Psychiatric:        Attention and Perception: Attention and perception normal.        Mood and Affect: Mood is anxious.        Speech: Speech normal.        Behavior: Behavior normal. Behavior is cooperative.        Thought Content: Thought content is delusional.        Cognition and Memory: Cognition and memory normal.        Judgment: Judgment is impulsive.     Review of Systems  Psychiatric/Behavioral:  The patient is nervous/anxious.   All other systems reviewed and are negative.   Blood pressure (!) 177/93, pulse 71, temperature 97.8 F (36.6 C), temperature source Oral, resp. rate 18, height 5\' 11"  (1.803 m), weight 91.6 kg, SpO2 100%.Body mass index is 28.17 kg/m.  General Appearance: Casual  Eye Contact:  Fair  Speech:  WDL  Volume:  WDL  Mood:  Anxious, depressed-mild  Affect:  Congruent  Thought Process:  Disorganized at times  Orientation:  Full (Time, Place, and Person)  Thought Content:  Occasional delusion  Suicidal Thoughts:  No  Homicidal Thoughts:  No  Memory:  Immediate;   Fair Recent;   Fair Remote;   Fair  Judgement:  Fair  Insight:  FAir  Psychomotor Activity:  WDL  Concentration:  FAir  Recall:  [employer] of Knowledge:  Fair  Language:  Good  Akathisia:  No  Handed:  Right  AIMS (if indicated):     Assets:  Leisure Time Physical Health Resilience  ADL's:  Intact  Cognition:  WDL  Sleep:  Number of Hours: 5      Physical Exam: Physical Exam Vitals and nursing note reviewed.  Constitutional:      Appearance: Normal appearance.  HENT:     Head: Normocephalic.     Nose: Nose normal.  Pulmonary:     Effort: Pulmonary effort is normal.  Musculoskeletal:        General: Normal range of motion.     Cervical  back: Normal range of motion.  Neurological:     General: No focal deficit present.     Mental Status: He is alert and oriented to person, place, and time.  Psychiatric:        Attention and Perception: Attention and perception normal.        Mood and Affect: Mood is anxious.        Speech: Speech normal.        Behavior: Behavior normal. Behavior is cooperative.        Thought Content: Thought content is delusional.        Cognition and Memory: Cognition and memory normal.        Judgment: Judgment is impulsive.    Review of Systems  Psychiatric/Behavioral:  The patient is nervous/anxious.   All other systems reviewed and are negative.  Blood pressure (!) 177/93, pulse 71, temperature 97.8 F (36.6 C), temperature source Oral, resp. rate 18, height 5\' 11"  (1.803 m), weight 91.6 kg, SpO2 100%. Body mass index is 28.17 kg/m.   Treatment Plan Summary: Daily contact with patient to assess and evaluate symptoms and progress in treatment, Medication management, and Plan : Schizophrenia, paranoid type: Prolixin 5 mg BID Depakote 500 mg BID, valproic acid level ordered  Anxiety: Gabapentin 200 mg TID  Insomnia: Trazodone 200 mg daily at bedtime.  LOS: 3-5 days  Catha Nottingham  Arthor Gorter, NP 09/28/2022, 1:13 PM

## 2022-09-28 NOTE — Group Note (Signed)
Date:  09/28/2022 Time:  9:36 PM  Group Topic/Focus:  Wrap-Up Group:   The focus of this group is to help patients review their daily goal of treatment and discuss progress on daily workbooks.    Participation Level:  Active  Participation Quality:  Appropriate and Attentive  Affect:  Appropriate  Cognitive:  Alert and Appropriate  Insight: Good  Engagement in Group:  Developing/Improving and Engaged  Modes of Intervention:  Limit-setting  Additional Comments:     Carmella Kees 09/28/2022, 9:36 PM

## 2022-09-28 NOTE — Group Note (Signed)
LCSW Group Therapy Note   Group Date: 09/28/2022 Start Time: 1300 End Time: 1400   Type of Therapy and Topic:  Group Therapy: Boundaries  Participation Level:  None  Description of Group: This group will address the use of boundaries in their personal lives. Patients will explore why boundaries are important, the difference between healthy and unhealthy boundaries, and negative and postive outcomes of different boundaries and will look at how boundaries can be crossed.  Patients will be encouraged to identify current boundaries in their own lives and identify what kind of boundary is being set. Facilitators will guide patients in utilizing problem-solving interventions to address and correct types boundaries being used and to address when no boundary is being used. Understanding and applying boundaries will be explored and addressed for obtaining and maintaining a balanced life. Patients will be encouraged to explore ways to assertively make their boundaries and needs known to significant others in their lives, using other group members and facilitator for role play, support, and feedback.  Therapeutic Goals:  1.  Patient will identify areas in their life where setting clear boundaries could be  used to improve their life.  2.  Patient will identify signs/triggers that a boundary is not being respected. 3.  Patient will identify two ways to set boundaries in order to achieve balance in  their lives: 4.  Patient will demonstrate ability to communicate their needs and set boundaries  through discussion and/or role plays  Summary of Patient Progress:   Patient was present in group. Patient was disorganized and unable to fully participate in the group discussion.  Patient stated "there are two words that make up my existance 'obtuses' and 'constitutions'.  There has been a lot of consumptions of 'obtuses.  You have to fight back.  You have to punch when they punch."    Therapeutic Modalities:    Cognitive Behavioral Therapy Solution-Focused Therapy  KALE RONDEAU, LCSWA 09/28/2022  2:52 PM

## 2022-09-29 DIAGNOSIS — F200 Paranoid schizophrenia: Secondary | ICD-10-CM | POA: Diagnosis not present

## 2022-09-29 MED ORDER — DIPHENHYDRAMINE HCL 25 MG PO CAPS: 25.0000 mg | ORAL_CAPSULE | Freq: Four times a day (QID) | ORAL | Status: DC | PRN

## 2022-09-29 MED ORDER — ESCITALOPRAM OXALATE 10 MG PO TABS
5.0000 mg | ORAL_TABLET | Freq: Every day | ORAL | Status: DC
  Administered 2022-09-30 – 2022-10-03 (×4): 5 mg via ORAL
  Filled 2022-09-29 (×4): qty 1

## 2022-09-29 NOTE — Progress Notes (Signed)
D- Patient alert, disoriented and disorganized. Affect blunted and anxious/mood disorganized. Denies SI/ HI/ AVH. Patient denies pain. Patient endorses depression and anxiety. Patient states in his self inventory "great medicine to take; I don't need medicine require bloodwork". Refuses Gabapentin and Lexapro. A- Compliant  with Prolixin only.  Support and encouragement provided.  Routine safety checks conducted every 15 minutes without incident.  Patient informed to notify staff with problems or concerns and verbalizes understanding. R- No adverse drug reactions noted with medications taken.  Patient non compliant with treatment plan.  Patient remains safe on the unit at this time.

## 2022-09-29 NOTE — Progress Notes (Signed)
[facility] MD Progress Note  09/29/2022 8:37 AM Terrance Watkins  MRN:  [id number]  Subjective:   Terrance Watkins is a 47 y.o. male with past medical history of schizophrenia who presents to the ED for psychiatric evaluation. Patient brought to the ED under IVC after his mother stated he has not been taking his medications and has been "arguing with people who are not there."   Nurses notes, labs, vital signs, and team meeting reviewed prior to meeting with the client.  He was able to have a calm conversation today with minimal rambling, easily redirected.  He denies anxiety, "It's not worry, it's concern more than anything.  The more I get treatment, the more it effects my life."  He is upset that he cannot get a driver's license, "They won't even see me".  Reports being depressed since 1993 when "people in my live, very ill people (mother), are destroying my whole life."  Moderate depression at a constant level.  Sleep is "really good", appetite is "good".  "I felt a little slow with the Depakote", valproic acid level is 58, WDL.  Will consider a low dose of antidepressant for his depression.  Principal Problem: Paranoid schizophrenia (HCC) Diagnosis: Principal Problem:   Paranoid schizophrenia (HCC)  Total Time spent with patient: 30 minutes  Past Psychiatric History: schizophrenia  Past Medical History:  Past Medical History:  Diagnosis Date   Paranoid schizophrenia (HCC)    History reviewed. No pertinent surgical history. Family History: History reviewed. No pertinent family history. Family Psychiatric  History: none Social History:  Social History   Substance and Sexual Activity  Alcohol Use Yes   Alcohol/week: 2.0 standard drinks of alcohol   Types: 2 Cans of beer per week     Social History   Substance and Sexual Activity  Drug Use Yes   Types: Marijuana    Social History   Socioeconomic History   Marital status: Single    Spouse name: Not on file   Number of children: Not on  file   Years of education: Not on file   Highest education level: Not on file  Occupational History   Not on file  Tobacco Use   Smoking status: Every Day    Types: Cigarettes   Smokeless tobacco: Former  Advertising account planner   Vaping status: Some Days   Substances: CBD  Substance and Sexual Activity   Alcohol use: Yes    Alcohol/week: 2.0 standard drinks of alcohol    Types: 2 Cans of beer per week   Drug use: Yes    Types: Marijuana   Sexual activity: Yes  Other Topics Concern   Not on file  Social History Narrative   Not on file   Social Determinants of Health   Financial Resource Strain: Not on file  Food Insecurity: No Food Insecurity (09/23/2022)   Hunger Vital Sign    Worried About Running Out of Food in the Last Year: Never true    Ran Out of Food in the Last Year: Never true  Transportation Needs: No Transportation Needs (09/23/2022)   PRAPARE - Administrator, Civil Service (Medical): No    Lack of Transportation (Non-Medical): No  Physical Activity: Not on file  Stress: Not on file  Social Connections: Not on file   Additional Social History: lives with his mother    Sleep: fair to good  Appetite:  Fair  Current Medications: Current Facility-Administered Medications  Medication Dose Route Frequency Provider Last  Rate Last Admin   acetaminophen (TYLENOL) tablet 650 mg  650 mg Oral Q6H PRN Bennett, Christal H, NP       alum & mag hydroxide-simeth (MAALOX/MYLANTA) 200-200-20 MG/5ML suspension 30 mL  30 mL Oral Q4H PRN Bennett, Christal H, NP       diphenhydrAMINE (BENADRYL) capsule 50 mg  50 mg Oral TID PRN Bennett, Christal H, NP       Or   diphenhydrAMINE (BENADRYL) injection 50 mg  50 mg Intramuscular TID PRN Bennett, Christal H, NP       divalproex (DEPAKOTE) DR tablet 500 mg  500 mg Oral Q12H Charm Rings, NP   500 mg at 09/28/22 2147   fluPHENAZine (PROLIXIN) tablet 5 mg  5 mg Oral BID Lewanda Rife, MD   5 mg at 09/28/22 1627   gabapentin  (NEURONTIN) capsule 200 mg  200 mg Oral TID Charm Rings, NP   200 mg at 09/28/22 0900   haloperidol (HALDOL) tablet 5 mg  5 mg Oral TID PRN Bennett, Christal H, NP       Or   haloperidol lactate (HALDOL) injection 5 mg  5 mg Intramuscular TID PRN Bennett, Christal H, NP       hydrochlorothiazide (HYDRODIURIL) tablet 12.5 mg  12.5 mg Oral Daily Bennett, Christal H, NP   12.5 mg at 09/28/22 1004   hydrOXYzine (ATARAX) tablet 50 mg  50 mg Oral TID PRN Bennett, Christal H, NP       lisinopril (ZESTRIL) tablet 20 mg  20 mg Oral Daily Bennett, Christal H, NP   20 mg at 09/28/22 0901   LORazepam (ATIVAN) tablet 2 mg  2 mg Oral TID PRN Bennett, Christal H, NP       Or   LORazepam (ATIVAN) injection 2 mg  2 mg Intramuscular TID PRN Bennett, Christal H, NP       magnesium hydroxide (MILK OF MAGNESIA) suspension 30 mL  30 mL Oral Daily PRN Bennett, Christal H, NP       nicotine (NICODERM CQ - dosed in mg/24 hours) patch 14 mg  14 mg Transdermal Daily Lewanda Rife, MD       traZODone (DESYREL) tablet 200 mg  200 mg Oral QHS Charm Rings, NP   200 mg at 09/28/22 2147    Lab Results:  Results for orders placed or performed during the hospital encounter of 09/23/22 (from the past 48 hour(s))  Valproic acid level     Status: None   Collection Time: 09/28/22  4:58 PM  Result Value Ref Range   Valproic Acid Lvl 58 50.0 - 100.0 ug/mL    Comment: Performed at [facility], [address] [place], [place] [address]     Blood Alcohol level:  Lab Results  Component Value Date   [facility] <10 09/22/2022    Metabolic Disorder Labs: Lab Results  Component Value Date   HGBA1C 5.3 09/25/2022   MPG 105 09/25/2022   No results found for: "PROLACTIN" Lab Results  Component Value Date   CHOL 162 09/25/2022   TRIG 96 09/25/2022   HDL 40 (L) 09/25/2022   CHOLHDL 4.1 09/25/2022   VLDL 19 09/25/2022   LDLCALC 103 (H) 09/25/2022     Musculoskeletal: Strength & Muscle Tone: within normal  limits Gait & Station: normal Patient leans: N/A  Psychiatric Specialty Exam: Physical Exam Vitals and nursing note reviewed.  Constitutional:      Appearance: Normal appearance.  HENT:  Head: Normocephalic.     Nose: Nose normal.  Pulmonary:     Effort: Pulmonary effort is normal.  Musculoskeletal:        General: Normal range of motion.     Cervical back: Normal range of motion.  Neurological:     General: No focal deficit present.     Mental Status: He is alert and oriented to person, place, and time.  Psychiatric:        Attention and Perception: Attention and perception normal.        Mood and Affect: Mood is depressed.        Speech: Speech normal.        Behavior: Behavior normal. Behavior is cooperative.        Thought Content: Thought content normal.        Cognition and Memory: Cognition and memory normal.        Judgment: Judgment normal.     Review of Systems  Psychiatric/Behavioral:  Positive for depression.   All other systems reviewed and are negative.   Blood pressure (!) 177/76, pulse 80, temperature 97.6 F (36.4 C), temperature source Oral, resp. rate 19, height 5\' 11"  (1.803 m), weight 91.6 kg, SpO2 100%.Body mass index is 28.17 kg/m.  General Appearance: Casual  Eye Contact:  Fair  Speech:  WDL  Volume:  WDL  Mood:  Anxious, depressed-mild  Affect:  Congruent  Thought Process:  Disorganized at times  Orientation:  Full (Time, Place, and Person)  Thought Content:  Occasional delusion  Suicidal Thoughts:  No  Homicidal Thoughts:  No  Memory:  Immediate;   Fair Recent;   Fair Remote;   Fair  Judgement:  Fair  Insight:  FAir  Psychomotor Activity:  WDL  Concentration:  FAir  Recall:  [employer] of Knowledge:  Fair  Language:  Good  Akathisia:  No  Handed:  Right  AIMS (if indicated):     Assets:  Leisure Time Physical Health Resilience  ADL's:  Intact  Cognition:  WDL  Sleep:  Number of Hours: 5      Physical Exam: Physical  Exam Vitals and nursing note reviewed.  Constitutional:      Appearance: Normal appearance.  HENT:     Head: Normocephalic.     Nose: Nose normal.  Pulmonary:     Effort: Pulmonary effort is normal.  Musculoskeletal:        General: Normal range of motion.     Cervical back: Normal range of motion.  Neurological:     General: No focal deficit present.     Mental Status: He is alert and oriented to person, place, and time.  Psychiatric:        Attention and Perception: Attention and perception normal.        Mood and Affect: Mood is depressed.        Speech: Speech normal.        Behavior: Behavior normal. Behavior is cooperative.        Thought Content: Thought content normal.        Cognition and Memory: Cognition and memory normal.        Judgment: Judgment normal.    Review of Systems  Psychiatric/Behavioral:  Positive for depression.   All other systems reviewed and are negative.  Blood pressure (!) 177/76, pulse 80, temperature 97.6 F (36.4 C), temperature source Oral, resp. rate 19, height 5\' 11"  (1.803 m), weight 91.6 kg, SpO2 100%. Body mass index is 28.17  kg/m.   Treatment Plan Summary: Daily contact with patient to assess and evaluate symptoms and progress in treatment, Medication management, and Plan : Schizophrenia, paranoid type: Prolixin 5 mg BID Depakote 500 mg BID, valproic acid level ordered-58 level, WDL  Depression: Lexapro 5 mg daily started  Anxiety: Gabapentin 200 mg TID  Insomnia: Trazodone 200 mg daily at bedtime.  LOS: Thursday discharge  Nanine Means, NP 09/29/2022, 8:37 AM

## 2022-09-29 NOTE — Progress Notes (Addendum)
Patient continues to refuse Gabapentin. NP notified via secure chat.

## 2022-09-29 NOTE — Group Note (Signed)
Date:  09/29/2022 Time:  5:49 PM  Group Topic/Focus:  Activity Group:  The focus of the group is to encourage the patients to come outside to the courtyard to get some fresh air to help assist their physical and mental wellbeing.    Participation Level:  Active  Participation Quality:  Appropriate  Affect:  Appropriate  Cognitive:  Alert  Insight: Appropriate  Engagement in Group:  Engaged  Modes of Intervention:  Activity  Additional Comments:    Terrance Watkins 09/29/2022, 5:49 PM

## 2022-09-29 NOTE — Progress Notes (Signed)
Nursing Shift Note:  1900-0700  Attended Evening Group: no Medication Compliant: yes  Behavior: pleasant Sleep Quality: good Significant Changes: none  The patient continues to struggle with delusional thoughts and poor insight.  [place] accepted his medications as ordered, but voiced concern about Depakote use.  The patient stated that he does not want frequent blood draws and he fears that his new medications with negate the benefits of his long-acting injectable.  [place] fell asleep before 2200.

## 2022-09-29 NOTE — Group Note (Signed)
Recreation Therapy Group Note   Group Topic:Problem Solving  Group Date: 09/29/2022 Start Time: 1000 End Time: 1100 Facilitators: Rosina Lowenstein, LRT, CTRS Location:  Craft Room  Group Description: Life Boat. Patients were given the scenario that they are on a boat that is about to become shipwrecked, leaving them stranded on an [place]. They are asked to make a list of 15 different items that they want to take with them when they are stranded on the [place]. Patients are asked to rank their items from most important to least important, #1 being the most important and #15 being the least. Patients will work individually for the first round to come up with 15 items and then as a group to condense their list and come up with one list of 15 items between all of them. Patients or LRT will read aloud the 15 different items to the group after each round. LRT facilitated post-activity processing to discuss how this activity can be used in daily life post discharge.   Goal Area(s) Addressed:  Patient will identify priorities, wants and needs. Patient will communicate with LRT and peers. Patient will work collectively as a Administrator, Civil Service. Patient will work on Product manager.    Affect/Mood: Appropriate   Participation Level: Active and Engaged   Participation Quality: Independent   Behavior: Appropriate, Calm, and Cooperative   Speech/Thought Process: Coherent   Insight: Good   Judgement: Fair    Modes of Intervention: Activity   Patient Response to Interventions:  Attentive, Engaged, Interested , and Receptive   Education Outcome:  Acknowledges education   Clinical Observations/Individualized Feedback: Terrance Watkins was active in their participation of session activities and group discussion. Pt identified "cotton, blanket, food, blanket and bucket" as some of the items he will bring with him on the [place]. Pt needed redirection to stop talking to peer. Pt was pleasant and followed  redirection with no issue.    Plan: Continue to engage patient in RT group sessions 2-3x/week.   Rosina Lowenstein, LRT, CTRS 09/29/2022 11:24 AM

## 2022-09-29 NOTE — Plan of Care (Signed)
Problem: Education: Goal: Knowledge of General Education information will improve Description: Including pain rating scale, medication(s)/side effects and non-pharmacologic comfort measures Outcome: Not Progressing   Problem: Health Behavior/Discharge Planning: Goal: Ability to manage health-related needs will improve Outcome: Not Progressing   Problem: Clinical Measurements: Goal: Ability to maintain clinical measurements within normal limits will improve Outcome: Not Progressing Goal: Will remain free from infection Outcome: Not Progressing Goal: Diagnostic test results will improve Outcome: Not Progressing Goal: Respiratory complications will improve Outcome: Not Progressing Goal: Cardiovascular complication will be avoided Outcome: Not Progressing   Problem: Activity: Goal: Risk for activity intolerance will decrease Outcome: Not Progressing   Problem: Nutrition: Goal: Adequate nutrition will be maintained Outcome: Not Progressing   Problem: Coping: Goal: Level of anxiety will decrease Outcome: Not Progressing   Problem: Elimination: Goal: Will not experience complications related to bowel motility Outcome: Not Progressing Goal: Will not experience complications related to urinary retention Outcome: Not Progressing   Problem: Pain Managment: Goal: General experience of comfort will improve Outcome: Not Progressing   Problem: Safety: Goal: Ability to remain free from injury will improve Outcome: Not Progressing   Problem: Skin Integrity: Goal: Risk for impaired skin integrity will decrease Outcome: Not Progressing   Problem: Education: Goal: Knowledge of [place] [facility] Education information/materials will improve Outcome: Not Progressing Goal: Emotional status will improve Outcome: Not Progressing Goal: Mental status will improve Outcome: Not Progressing Goal: Verbalization of understanding the information provided will improve Outcome: Not  Progressing   Problem: Activity: Goal: Interest or engagement in activities will improve Outcome: Not Progressing Goal: Sleeping patterns will improve Outcome: Not Progressing   Problem: Coping: Goal: Ability to verbalize frustrations and anger appropriately will improve Outcome: Not Progressing Goal: Ability to demonstrate self-control will improve Outcome: Not Progressing   Problem: Health Behavior/Discharge Planning: Goal: Identification of resources available to assist in meeting health care needs will improve Outcome: Not Progressing Goal: Compliance with treatment plan for underlying cause of condition will improve Outcome: Not Progressing   Problem: Physical Regulation: Goal: Ability to maintain clinical measurements within normal limits will improve Outcome: Not Progressing   Problem: Safety: Goal: Periods of time without injury will increase Outcome: Not Progressing   Problem: Education: Goal: Utilization of techniques to improve thought processes will improve Outcome: Not Progressing Goal: Knowledge of the prescribed therapeutic regimen will improve Outcome: Not Progressing   Problem: Activity: Goal: Interest or engagement in leisure activities will improve Outcome: Not Progressing Goal: Imbalance in normal sleep/wake cycle will improve Outcome: Not Progressing   Problem: Coping: Goal: Coping ability will improve Outcome: Not Progressing Goal: Will verbalize feelings Outcome: Not Progressing   Problem: Health Behavior/Discharge Planning: Goal: Ability to make decisions will improve Outcome: Not Progressing Goal: Compliance with therapeutic regimen will improve Outcome: Not Progressing   Problem: Role Relationship: Goal: Will demonstrate positive changes in social behaviors and relationships Outcome: Not Progressing   Problem: Safety: Goal: Ability to disclose and discuss suicidal ideas will improve Outcome: Not Progressing Goal: Ability to identify  and utilize support systems that promote safety will improve Outcome: Not Progressing   Problem: Self-Concept: Goal: Will verbalize positive feelings about self Outcome: Not Progressing Goal: Level of anxiety will decrease Outcome: Not Progressing   Problem: Activity: Goal: Will verbalize the importance of balancing activity with adequate rest periods Outcome: Not Progressing   Problem: Education: Goal: Will be free of psychotic symptoms Outcome: Not Progressing Goal: Knowledge of the prescribed therapeutic regimen will improve Outcome: Not Progressing  Problem: Coping: Goal: Coping ability will improve Outcome: Not Progressing Goal: Will verbalize feelings Outcome: Not Progressing   Problem: Health Behavior/Discharge Planning: Goal: Compliance with prescribed medication regimen will improve Outcome: Not Progressing   Problem: Nutritional: Goal: Ability to achieve adequate nutritional intake will improve Outcome: Not Progressing

## 2022-09-30 MED ORDER — HYDROCHLOROTHIAZIDE 25 MG PO TABS
25.0000 mg | ORAL_TABLET | Freq: Every day | ORAL | Status: DC
  Administered 2022-10-01 – 2022-10-03 (×3): 25 mg via ORAL
  Filled 2022-09-30 (×3): qty 1

## 2022-09-30 MED ORDER — CLONIDINE HCL 0.1 MG PO TABS
0.1000 mg | ORAL_TABLET | Freq: Once | ORAL | Status: AC
  Administered 2022-09-30: 0.1 mg via ORAL
  Filled 2022-09-30: qty 1

## 2022-09-30 NOTE — BHH Counselor (Signed)
CSW met with guardian, Terrance Watkins (mother) to go over important message from medicare. CSW and Thieme discussed this paperwork and she was shown contact information for [employer]. [name] signed paperwork. She asked if she would get a copy of this. CSW offered to make a copy and bring it back to her. She agreed. CSW made her a copy, highlighted the contact information for [employer], and brought it back to guardian. No other concerns expressed. Contact ended without incident.   Copy of IM scanned to medical records and original placed in bin for collection by [facility].   [name]. Algis Greenhouse, MSW, LCSW, LCAS 09/30/2022 3:31 PM

## 2022-09-30 NOTE — Plan of Care (Signed)
  Problem: Safety: Goal: Ability to remain free from injury will improve Outcome: Progressing   

## 2022-09-30 NOTE — Progress Notes (Signed)
Nursing Shift Note:  1900-0700   Medication Compliant:  Yes Behavior: Anxious; suspicious Sleep Quality: Good Significant Changes: None   09/30/22 0400  Psych Admission Type (Psych Patients Only)  Admission Status Involuntary  Psychosocial Assessment  Patient Complaints Anxiety;Confusion;Suspiciousness  Eye Contact Glaring  Facial Expression Anxious  Affect Apprehensive  Speech Soft  Interaction Cautious  Motor Activity Slow  Appearance/Hygiene Disheveled  Behavior Characteristics Cooperative;Anxious  Mood Suspicious  Thought Process  Coherency Disorganized  Content Delusions  Delusions Paranoid  Perception Derealization  Hallucination None reported or observed  Judgment Poor  Confusion Mild  Danger to Self  Current suicidal ideation? Denies  Danger to Others  Danger to Others None reported or observed  Danger to Others Abnormal  Harmful Behavior to others No threats or harm toward other people  Destructive Behavior No threats or harm toward property

## 2022-09-30 NOTE — Group Note (Signed)
Date:  09/30/2022 Time:  5:00 AM  Group Topic/Focus:  Healthy Communication:   The focus of this group is to discuss communication, barriers to communication, as well as healthy ways to communicate with others.    Participation Level:  Active  Participation Quality:  Appropriate and Attentive  Affect:  Appropriate  Cognitive:  Alert and Appropriate  Insight: Good  Engagement in Group:  Developing/Improving  Modes of Intervention:  Clarification, Discussion, Education, and Support  Additional Comments:     Gladis Soley 09/30/2022, 5:00 AM

## 2022-09-30 NOTE — Group Note (Signed)
Date:  09/30/2022 Time:  5:40 PM  Group Topic/Focus:  Activity Group :  The focus of the group is to encourage patients to go outside and get some fresh air to assist with their recovery mentally and physically.    Participation Level:  Active  Participation Quality:  Appropriate  Affect:  Appropriate  Cognitive:  Appropriate  Insight: Appropriate  Engagement in Group:  Engaged  Modes of Intervention:  Activity  Additional Comments:    Mary Sella Kue Fox 09/30/2022, 5:40 PM

## 2022-09-30 NOTE — Plan of Care (Signed)
Pt is anxious and preoccupied, slightly disorganized, but progressing and cooperative with treatment plan.

## 2022-09-30 NOTE — Progress Notes (Signed)
[facility] MD Progress Note  09/30/2022 3:55 PM DECORIAN SCHUENEMANN  MRN:  [id number]  Subjective:   Terrance Watkins is a 47 y.o. male with past medical history of schizophrenia who presents to the ED for psychiatric evaluation. Patient brought to the ED under IVC after his mother stated he has not been taking his medications and has been "arguing with people who are not there."   Nurses notes, labs, vital signs, and team meeting reviewed prior to meeting with the client.  Today, he was not disorganized and did not need redirection. He denied depression and feels "more emotional about people who care about me, they are good people."  Appetite is fair.  Denies hallucinations, paranoia, and suicidal ideations.  Sleep was good last night, Trazodone helps.  He does acknowledged that "my energy has decreased because it was too high".  Denies side effects from his medications and did confirm he needed his Risperdal Ladean Raya which we are hoping to give prior to discharge, confirming his last dose with his provider, awaiting call back as he is scheduled for discharge tomorrow and feels ready.  Principal Problem: Paranoid schizophrenia (HCC) Diagnosis: Principal Problem:   Paranoid schizophrenia (HCC)  Total Time spent with patient: 30 minutes  Past Psychiatric History: schizophrenia  Past Medical History:  Past Medical History:  Diagnosis Date   Paranoid schizophrenia (HCC)    History reviewed. No pertinent surgical history. Family History: History reviewed. No pertinent family history. Family Psychiatric  History: none Social History:  Social History   Substance and Sexual Activity  Alcohol Use Yes   Alcohol/week: 2.0 standard drinks of alcohol   Types: 2 Cans of beer per week     Social History   Substance and Sexual Activity  Drug Use Yes   Types: Marijuana    Social History   Socioeconomic History   Marital status: Single    Spouse name: Not on file   Number of children: Not on file   Years  of education: Not on file   Highest education level: Not on file  Occupational History   Not on file  Tobacco Use   Smoking status: Every Day    Types: Cigarettes   Smokeless tobacco: Former  Advertising account planner   Vaping status: Some Days   Substances: CBD  Substance and Sexual Activity   Alcohol use: Yes    Alcohol/week: 2.0 standard drinks of alcohol    Types: 2 Cans of beer per week   Drug use: Yes    Types: Marijuana   Sexual activity: Yes  Other Topics Concern   Not on file  Social History Narrative   Not on file   Social Determinants of Health   Financial Resource Strain: Not on file  Food Insecurity: No Food Insecurity (09/23/2022)   Hunger Vital Sign    Worried About Running Out of Food in the Last Year: Never true    Ran Out of Food in the Last Year: Never true  Transportation Needs: No Transportation Needs (09/23/2022)   PRAPARE - Administrator, Civil Service (Medical): No    Lack of Transportation (Non-Medical): No  Physical Activity: Not on file  Stress: Not on file  Social Connections: Not on file   Additional Social History: lives with his mother    Sleep: good  Appetite:  Fair  Current Medications: Current Facility-Administered Medications  Medication Dose Route Frequency Provider Last Rate Last Admin   acetaminophen (TYLENOL) tablet 650 mg  650 mg  Oral Q6H PRN Willeen Cass, Christal H, NP       alum & mag hydroxide-simeth (MAALOX/MYLANTA) 200-200-20 MG/5ML suspension 30 mL  30 mL Oral Q4H PRN Bennett, Christal H, NP       diphenhydrAMINE (BENADRYL) capsule 25 mg  25 mg Oral Q6H PRN Charm Rings, NP       diphenhydrAMINE (BENADRYL) capsule 50 mg  50 mg Oral TID PRN Bennett, Christal H, NP       Or   diphenhydrAMINE (BENADRYL) injection 50 mg  50 mg Intramuscular TID PRN Bennett, Christal H, NP       divalproex (DEPAKOTE) DR tablet 500 mg  500 mg Oral Q12H Charm Rings, NP   500 mg at 09/30/22 0856   escitalopram (LEXAPRO) tablet 5 mg  5 mg Oral  Daily Charm Rings, NP   5 mg at 09/30/22 0865   fluPHENAZine (PROLIXIN) tablet 5 mg  5 mg Oral BID Lewanda Rife, MD   5 mg at 09/30/22 0856   gabapentin (NEURONTIN) capsule 200 mg  200 mg Oral TID Charm Rings, NP   200 mg at 09/28/22 0900   haloperidol (HALDOL) tablet 5 mg  5 mg Oral TID PRN Willeen Cass, Christal H, NP       Or   haloperidol lactate (HALDOL) injection 5 mg  5 mg Intramuscular TID PRN Bennett, Christal H, NP       hydrochlorothiazide (HYDRODIURIL) tablet 12.5 mg  12.5 mg Oral Daily Bennett, Christal H, NP   12.5 mg at 09/30/22 0856   lisinopril (ZESTRIL) tablet 20 mg  20 mg Oral Daily Bennett, Christal H, NP   20 mg at 09/30/22 0855   LORazepam (ATIVAN) tablet 2 mg  2 mg Oral TID PRN Bennett, Christal H, NP       Or   LORazepam (ATIVAN) injection 2 mg  2 mg Intramuscular TID PRN Bennett, Christal H, NP       magnesium hydroxide (MILK OF MAGNESIA) suspension 30 mL  30 mL Oral Daily PRN Bennett, Christal H, NP       nicotine (NICODERM CQ - dosed in mg/24 hours) patch 14 mg  14 mg Transdermal Daily Lewanda Rife, MD       traZODone (DESYREL) tablet 200 mg  200 mg Oral QHS Charm Rings, NP   200 mg at 09/29/22 2107    Lab Results:  Results for orders placed or performed during the hospital encounter of 09/23/22 (from the past 48 hour(s))  Valproic acid level     Status: None   Collection Time: 09/28/22  4:58 PM  Result Value Ref Range   Valproic Acid Lvl 58 50.0 - 100.0 ug/mL    Comment: Performed at [facility], [address] [place], [place] [address]     Blood Alcohol level:  Lab Results  Component Value Date   [facility] <10 09/22/2022    Metabolic Disorder Labs: Lab Results  Component Value Date   HGBA1C 5.3 09/25/2022   MPG 105 09/25/2022   No results found for: "PROLACTIN" Lab Results  Component Value Date   CHOL 162 09/25/2022   TRIG 96 09/25/2022   HDL 40 (L) 09/25/2022   CHOLHDL 4.1 09/25/2022   VLDL 19 09/25/2022   LDLCALC 103  (H) 09/25/2022     Musculoskeletal: Strength & Muscle Tone: within normal limits Gait & Station: normal Patient leans: N/A  Psychiatric Specialty Exam: Physical Exam Vitals and nursing note reviewed.  Constitutional:  Appearance: Normal appearance.  HENT:     Head: Normocephalic.     Nose: Nose normal.  Pulmonary:     Effort: Pulmonary effort is normal.  Musculoskeletal:        General: Normal range of motion.     Cervical back: Normal range of motion.  Neurological:     General: No focal deficit present.     Mental Status: He is alert and oriented to person, place, and time.  Psychiatric:        Attention and Perception: Attention and perception normal.        Mood and Affect: Mood is anxious.        Speech: Speech normal.        Behavior: Behavior normal. Behavior is cooperative.        Thought Content: Thought content normal.        Cognition and Memory: Cognition and memory normal.        Judgment: Judgment normal.     Review of Systems  Psychiatric/Behavioral:  The patient is nervous/anxious.   All other systems reviewed and are negative.   Blood pressure (!) 153/81, pulse 64, temperature 98.4 F (36.9 C), temperature source Oral, resp. rate 19, height 5\' 11"  (1.803 m), weight 91.6 kg, SpO2 99%.Body mass index is 28.17 kg/m.  General Appearance: Casual  Eye Contact:  Fair  Speech:  WDL  Volume:  WDL  Mood:  Anxious, mild  Affect:  Congruent  Thought Procedure:  Logical  Orientation:  Full (Time, Place, and Person)  Thought Content:  goal oriented, WDL  Suicidal Thoughts:  No  Homicidal Thoughts:  No  Memory:  Immediate;   Fair Recent;   Fair Remote;   Fair  Judgement:  Fair  Insight:  Good  Psychomotor Activity:  WDL  Concentration:  Good  Recall:  Fair  Fund of Knowledge:  Good  Language:  Good  Akathisia:  No  Handed:  Right  AIMS (if indicated):     Assets:  Leisure Time Physical Health Resilience  ADL's:  Intact  Cognition:  WDL   Sleep:  Number of Hours: 5      Physical Exam: Physical Exam Vitals and nursing note reviewed.  Constitutional:      Appearance: Normal appearance.  HENT:     Head: Normocephalic.     Nose: Nose normal.  Pulmonary:     Effort: Pulmonary effort is normal.  Musculoskeletal:        General: Normal range of motion.     Cervical back: Normal range of motion.  Neurological:     General: No focal deficit present.     Mental Status: He is alert and oriented to person, place, and time.  Psychiatric:        Attention and Perception: Attention and perception normal.        Mood and Affect: Mood is anxious.        Speech: Speech normal.        Behavior: Behavior normal. Behavior is cooperative.        Thought Content: Thought content normal.        Cognition and Memory: Cognition and memory normal.        Judgment: Judgment normal.    Review of Systems  Psychiatric/Behavioral:  The patient is nervous/anxious.   All other systems reviewed and are negative.  Blood pressure (!) 153/81, pulse 64, temperature 98.4 F (36.9 C), temperature source Oral, resp. rate 19, height 5\' 11"  (1.803 m),  weight 91.6 kg, SpO2 99%. Body mass index is 28.17 kg/m.   Treatment Plan Summary: Daily contact with patient to assess and evaluate symptoms and progress in treatment, Medication management, and Plan : Schizophrenia, paranoid type: Prolixin 5 mg BID Depakote 500 mg BID, valproic acid level ordered-58 level, WDL Risperdal Constance, confirming last dose   Depression: Lexapro 5 mg daily started  Anxiety: Gabapentin 200 mg TID  Insomnia: Trazodone 200 mg daily at bedtime.  LOS: Friday discharge  Nanine Means, NP 09/30/2022, 3:55 PM

## 2022-09-30 NOTE — Plan of Care (Signed)
  Problem: Education: Goal: Knowledge of General Education information will improve Description: Including pain rating scale, medication(s)/side effects and non-pharmacologic comfort measures Outcome: Progressing   Problem: Coping: Goal: Level of anxiety will decrease Outcome: Progressing   Problem: Safety: Goal: Ability to remain free from injury will improve Outcome: Progressing   Problem: Education: Goal: Emotional status will improve Outcome: Progressing Goal: Mental status will improve Outcome: Progressing   

## 2022-09-30 NOTE — BHH Counselor (Signed)
CSWE received call from patient's mother.  Mother reports that she does not think that the patient is appropriate for discharge.   Penni Homans, MSW, LCSW 09/30/2022 10:14 AM

## 2022-09-30 NOTE — Group Note (Signed)
Recreation Therapy Group Note   Group Topic:Goal Setting  Group Date: 09/30/2022 Start Time: 1000 End Time: 1105 Facilitators: Rosina Lowenstein, LRT, CTRS Location:  Day Room  Group Description: Vision Board. Patients were given many different magazines, a glue stick, markers, and a piece of cardstock paper. LRT and pts discussed the importance of having goals in life. LRT and pts discussed the difference between short-term and long-term goals, as well as what a SMART goal is. LRT encouraged pts to create a vision board, with images they picked and then cut out with safety scissors from the magazine, for themselves, that capture their short and long-term goals. LRT encouraged pts to show and explain their vision board to the group. LRT offered to laminate vision board once dry and complete.   Goal Area(s) Addressed:  Patient will gain knowledge of short vs. long term goals.  Patient will identify goals for themselves. Patient will practice setting SMART goals. Patient will verbalize their goals to LRT and peers.   Affect/Mood: Appropriate   Participation Level: Active and Engaged   Participation Quality: Independent   Behavior: Calm and Cooperative   Speech/Thought Process: Coherent   Insight: Good   Judgement: Good   Modes of Intervention: Art and Activity   Patient Response to Interventions:  Attentive, Engaged, Interested , and Receptive   Education Outcome:  Acknowledges education   Clinical Observations/Individualized Feedback: Avenir was active in their participation of session activities and group discussion. Pt identified "I want to make this smoothie. I think its maybe strawberry and banana but I want it to have protein in it so I will gain some of my strength back because I am weak now" as his goal. Pt interacted well with LRT and peers duration of session.   Plan: Continue to engage patient in RT group sessions 2-3x/week.   Rosina Lowenstein, LRT, CTRS 09/30/2022  11:49 AM

## 2022-09-30 NOTE — Progress Notes (Signed)
   09/30/22 0916  Psych Admission Type (Psych Patients Only)  Admission Status Involuntary  Psychosocial Assessment  Patient Complaints Anxiety;Confusion;Suspiciousness  Eye Contact Brief  Facial Expression Anxious;Flat  Affect Apprehensive;Preoccupied  Speech Soft  Interaction Cautious  Motor Activity Slow  Appearance/Hygiene Disheveled  Behavior Characteristics Anxious  Mood Suspicious;Preoccupied  Thought Process  Coherency Disorganized  Content Delusions;Paranoia  Delusions Paranoid  Perception Depersonalization  Hallucination None reported or observed  Judgment Poor  Confusion Mild  Danger to Self  Current suicidal ideation? Denies  Agreement Not to Harm Self No  Description of Agreement Verbal  Danger to Others  Danger to Others None reported or observed  Danger to Others Abnormal  Harmful Behavior to others No threats or harm toward other people  Destructive Behavior No threats or harm toward property   Patient is alert and oriented X4. Patient denies SI/HI/AVH.  Patient denies pain.  Patient with disorganized thoughts.  Patient states he has had 145 studies done on him and he does not know why.  Patient with long rambling conversations.  BP elevated.  Provided notified.  Clonidine 0.1 MG PO given.

## 2022-09-30 NOTE — Group Note (Signed)
Date:  09/30/2022 Time:  9:28 PM  Group Topic/Focus:  Goals Group:   The focus of this group is to help patients establish daily goals to achieve during treatment and discuss how the patient can incorporate goal setting into their daily lives to aide in recovery.    Participation Level:  Active  Participation Quality:  Appropriate  Affect:  Appropriate  Cognitive:  Appropriate  Insight: Appropriate  Engagement in Group:  Engaged  Modes of Intervention:  Discussion and Education  Additional Comments:    Jeannette How 09/30/2022, 9:28 PM

## 2022-10-01 MED ORDER — CLONIDINE HCL 0.1 MG PO TABS
0.1000 mg | ORAL_TABLET | Freq: Four times a day (QID) | ORAL | Status: DC | PRN
  Administered 2022-10-01 – 2022-10-02 (×2): 0.1 mg via ORAL
  Filled 2022-10-01 (×2): qty 1

## 2022-10-01 MED ORDER — RISPERIDONE MICROSPHERES ER 50 MG IM SRER: 50.0000 mg | INTRAMUSCULAR | 0 refills | Status: AC

## 2022-10-01 MED ORDER — HYDROCHLOROTHIAZIDE 25 MG PO TABS: 25.0000 mg | ORAL_TABLET | Freq: Every day | ORAL | 0 refills | Status: AC

## 2022-10-01 MED ORDER — RISPERIDONE MICROSPHERES ER 25 MG IM SRER
25.0000 mg | INTRAMUSCULAR | Status: DC
  Administered 2022-10-01: 25 mg via INTRAMUSCULAR
  Filled 2022-10-01: qty 2

## 2022-10-01 MED ORDER — DIVALPROEX SODIUM 500 MG PO DR TAB: 500.0000 mg | DELAYED_RELEASE_TABLET | Freq: Two times a day (BID) | ORAL | 0 refills | Status: AC

## 2022-10-01 MED ORDER — FLUPHENAZINE HCL 5 MG PO TABS: 5.0000 mg | ORAL_TABLET | Freq: Two times a day (BID) | ORAL | 0 refills | Status: AC

## 2022-10-01 MED ORDER — RISPERIDONE MICROSPHERES ER 25 MG IM SRER: 25.0000 mg | INTRAMUSCULAR | 0 refills | Status: AC

## 2022-10-01 MED ORDER — TRAZODONE HCL 100 MG PO TABS: 200.0000 mg | ORAL_TABLET | Freq: Every day | ORAL | 0 refills | Status: AC

## 2022-10-01 MED ORDER — LISINOPRIL 20 MG PO TABS: 20.0000 mg | ORAL_TABLET | Freq: Every day | ORAL | 0 refills | Status: AC

## 2022-10-01 MED ORDER — GABAPENTIN 100 MG PO CAPS: 200.0000 mg | ORAL_CAPSULE | Freq: Three times a day (TID) | ORAL | 0 refills | Status: AC

## 2022-10-01 MED ORDER — RISPERIDONE MICROSPHERES ER 50 MG IM SRER
50.0000 mg | INTRAMUSCULAR | Status: DC
  Administered 2022-10-01: 50 mg via INTRAMUSCULAR
  Filled 2022-10-01: qty 2

## 2022-10-01 MED ORDER — ESCITALOPRAM OXALATE 5 MG PO TABS: 5.0000 mg | ORAL_TABLET | Freq: Every day | ORAL | 0 refills | Status: AC

## 2022-10-01 NOTE — Group Note (Signed)
Recreation Therapy Group Note   Group Topic:Leisure Education  Group Date: 10/01/2022 Start Time: 1000 End Time: 1100 Facilitators: Rosina Lowenstein, LRT, CTRS Location:  Craft Room  Group Description: Leisure. Patients were given the option to choose from coloring, singing karaoke, making origami, journaling or listening to music. LRT and pts discussed the meaning of leisure, the importance of participating in leisure during their free time/when they're outside of the hospital, as well as how our leisure interests can also serve as coping skills. Pt identified two leisure interests and shared with the group.    Goal Area(s) Addressed:  Patient will identify a current leisure interest.  Patient will learn the definition of "leisure". Patient will practice making a positive decision. Patient will have the opportunity to try a new leisure activity. Patient will communicate with peers and LRT.  Affect/Mood: Sad   Participation Level: Active and Engaged   Participation Quality: Independent   Behavior: Appropriate and Calm   Speech/Thought Process: Coherent   Insight: Fair   Judgement: Fair    Modes of Intervention: Activity   Patient Response to Interventions:  Attentive, Engaged, Interested , and Receptive   Education Outcome:  Acknowledges education   Clinical Observations/Individualized Feedback: [place]sh was active in their participation of session activities and group discussion. Pt identified "I like to mow the grass when I have the energy to do that and I like to read" as things he does in his free time. Pt chose to journal while in group. Of note, pt randomly started crying during group. Pt shared that he is worried about transportation/lack thereof and shared that where he was living no one cared for him. LRT offered tissues, verbal comfort and reassurance. Pt was receptive and sat still looking upset the rest of group.  Plan: Continue to engage patient in RT group  sessions 2-3x/week.   Rosina Lowenstein, LRT, CTRS 10/01/2022 11:39 AM

## 2022-10-01 NOTE — [facility] Suicide Risk Assessment (Signed)
[facility] Discharge Suicide Risk Assessment   Principal Problem: Paranoid schizophrenia [facility]) Discharge Diagnoses: Principal Problem:   Paranoid schizophrenia (HCC)   Total Time spent with patient: 45 minutes  Terrance Watkins is a 47 y.o. male with past medical history of schizophrenia who presents to the ED for psychiatric evaluation. Patient brought to the ED under IVC after his mother stated he has not been taking his medications and has been "arguing with people who are not there." Medications were started and adjusted along with therapy.  The client stabilized and has met maximum benefit of hospitalization.  He does have an ACT team, [place], who will continue to follow him. Denies suicidal/homicidal ideations, hallucinations, paranoia, and substance abuse.  Psychiatrically stable for discharge.  Discharge instructions provided with explanations along with Rx and crisis number.  Musculoskeletal: Strength & Muscle Tone: within normal limits Gait & Station: normal Patient leans: N/A  Psychiatric Specialty Exam: Physical Exam Vitals and nursing note reviewed.  Constitutional:      Appearance: Normal appearance.  HENT:     Head: Normocephalic.     Nose: Nose normal.  Pulmonary:     Effort: Pulmonary effort is normal.  Musculoskeletal:        General: Normal range of motion.     Cervical back: Normal range of motion.  Neurological:     General: No focal deficit present.     Mental Status: He is alert and oriented to person, place, and time.  Psychiatric:        Attention and Perception: Attention and perception normal.        Mood and Affect: Mood is anxious.        Speech: Speech normal.        Behavior: Behavior normal. Behavior is cooperative.        Thought Content: Thought content normal.        Cognition and Memory: Cognition and memory normal.        Judgment: Judgment normal.    Review of Systems  Psychiatric/Behavioral:  The patient is nervous/anxious.   All other  systems reviewed and are negative.   Blood pressure (!) 150/71, pulse (!) 58, temperature 97.8 F (36.6 C), resp. rate 19, height 5\' 11"  (1.803 m), weight 91.6 kg, SpO2 100%.Body mass index is 28.17 kg/m.  General Appearance: Casual  Eye Contact:  Good  Speech:  Normal Rate  Volume:  Normal  Mood:  Anxious  Affect:  Congruent  Thought Process:  Coherent  Orientation:  Full (Time, Place, and Person)  Thought Content:  Logical  Suicidal Thoughts:  No  Homicidal Thoughts:  No  Memory:  Immediate;   Good Recent;   Good Remote;   Good  Judgement:  Fair  Insight:  Fair  Psychomotor Activity:  Normal  Concentration:  Concentration: Good and Attention Span: Good  Recall:  Good  Fund of Knowledge:  Good  Language:  Good  Akathisia:  No  Handed:  Right  AIMS (if indicated):     Assets:  Housing Leisure Time Physical Health Resilience Social Support  ADL's:  Intact  Cognition:  WNL  Sleep:  Number of Hours: 5     Physical Exam: Physical Exam Vitals and nursing note reviewed.  Constitutional:      Appearance: Normal appearance.  HENT:     Head: Normocephalic.     Nose: Nose normal.  Pulmonary:     Effort: Pulmonary effort is normal.  Musculoskeletal:  General: Normal range of motion.     Cervical back: Normal range of motion.  Neurological:     General: No focal deficit present.     Mental Status: He is alert and oriented to person, place, and time.  Psychiatric:        Attention and Perception: Attention and perception normal.        Mood and Affect: Mood is anxious.        Speech: Speech normal.        Behavior: Behavior normal. Behavior is cooperative.        Thought Content: Thought content normal.        Cognition and Memory: Cognition and memory normal.        Judgment: Judgment normal.   Review of Systems  Psychiatric/Behavioral:  The patient is nervous/anxious.   All other systems reviewed and are negative.  Blood pressure (!) 150/71, pulse (!)  58, temperature 97.8 F (36.6 C), resp. rate 19, height 5\' 11"  (1.803 m), weight 91.6 kg, SpO2 100%. Body mass index is 28.17 kg/m.  Mental Status Per Nursing Assessment::   On Admission:  NA  Demographic Factors:  Male and Caucasian  Loss Factors: NA  Historical Factors: NA  Risk Reduction Factors:   Sense of responsibility to family, Living with another person, especially a relative, Positive social support, Positive therapeutic relationship, and Positive coping skills or problem solving skills  Continued Clinical Symptoms:  Anxiety, mild  Cognitive Features That Contribute To Risk:  None    Suicide Risk:  Minimal: No identifiable suicidal ideation.  Patients presenting with no risk factors but with morbid ruminations; may be classified as minimal risk based on the severity of the depressive symptoms   Plan Of Care/Follow-up recommendations:  Activity:  as tolerated Diet:  heart healthy diet Schizophrenia, paranoid type: Prolixin 5 mg BID Depakote 500 mg BID, valproic acid level ordered-58 level, WDL Risperdal Constance, confirming last dose    Depression: Lexapro 5 mg daily    Anxiety: Gabapentin 200 mg TID   Insomnia: Trazodone 200 mg daily at bedtime.  Nanine Means, NP 10/01/2022, 9:00 AM

## 2022-10-01 NOTE — Progress Notes (Signed)
   10/01/22 1000  Spiritual Encounters  Type of Visit Initial  Care provided to: Patient  Referral source Chaplain assessment  Reason for visit Routine spiritual support  OnCall Visit No  Spiritual Framework  Presenting Themes Meaning/purpose/sources of inspiration;Goals in life/care;Coping tools  Community/Connection Family  Patient Stress Factors Major life changes  Family Stress Factors None identified  Interventions  Spiritual Care Interventions Made Established relationship of care and support;Compassionate presence;Encouragement  Intervention Outcomes  Outcomes Awareness of support;Reduced anxiety  Spiritual Care Plan  Spiritual Care Issues Still Outstanding No further spiritual care needs at this time (see row info)   Chaplain met with patient while doing rounds. Patient shared with chaplain his list of goals that he hopes to accomplish when he leaves the facility. Patient is looking forward to being discharged and fulfilling his goals. Patient told the chaplain that he was glad I came and spoke with him today. He said it meant a lot to him. Chaplain will continue to follow up with patient.

## 2022-10-01 NOTE — Plan of Care (Addendum)
Problem: Clinical Measurements: Goal: Will remain free from infection Outcome: Progressing   Problem: Coping: Goal: Level of anxiety will decrease Outcome: Progressing   Problem: Safety: Goal: Ability to remain free from injury will improve Outcome: Progressing  Pt A & O to self, place and situation. Denies SI, HI, AVH and pain. Remains medication compliant except with Gabapentin "I don't want to get addicted to no pain medicines. I don't want it" and denies adverse drug reactions. However, pt's vitals remains elevated and he remains asymptomatic. Assigned provider notified of pt's BP 178/94, pulse WNL. New order received for Clonidine 0.1 mg PO given will recheck vitals. Safety checks maintained at Q 15 minutes intervals without incident. Tolerated meals and fluids well.

## 2022-10-01 NOTE — Group Note (Signed)
Date:  10/01/2022 Time:  10:04 AM  Group Topic/Focus:  Goals Group:   The focus of this group is to help patients establish daily goals to achieve during treatment and discuss how the patient can incorporate goal setting into their daily lives to aide in recovery.    Participation Level:  Active  Participation Quality:  Appropriate  Affect:  Appropriate  Cognitive:  Appropriate  Insight: Appropriate  Engagement in Group:  Engaged  Modes of Intervention:  Discussion, Education, and Support  Additional Comments:    Wilford Corner 10/01/2022, 10:04 AM

## 2022-10-01 NOTE — Group Note (Signed)
Date:  10/01/2022 Time:  10:28 PM  Group Topic/Focus:  Wrap-Up Group:   The focus of this group is to help patients review their daily goal of treatment and discuss progress on daily workbooks.    Participation Level:  Active  Participation Quality:  Appropriate, Attentive, Sharing, and Supportive  Affect:  Appropriate  Cognitive:  Appropriate  Insight: Appropriate and Good  Engagement in Group:  Supportive  Modes of Intervention:  Support  Additional Comments:     Belva Crome 10/01/2022, 10:28 PM

## 2022-10-01 NOTE — Progress Notes (Signed)
This provider spoke with Dr Michae Kava from his ACT team, [facility], who confirmed his dosage and date of his last Risperdal Ladean Raya of 75 mg, last dose on 7/5.  "He has been on this for years and needs it" when asked about the 75 mg dosage.  Medications updated with this provider with Rx going to [facility].  Nanine Means, PMHNP

## 2022-10-01 NOTE — BHH Counselor (Signed)
DND and HINN 12 reviewed with guardian, Kathie Rhodes Burkitt/mother [phone]). She voiced understanding of information presented to her. She asked for the number to contact Medicare if she has any further questions and received this ([phone]). HINN 12 was signed and noted with verbal permission from guardian. Ringer again expressed that she did not know why they would send him home with high blood pressure and still angry. She stated that Medicaid should cover everything else. CSW explained that he was uncertain about this but could try to contact the finance people here to ascertain information on this. No other concerns expressed. Contact ended without incident.   [name]. Algis Greenhouse, MSW, LCSW, LCAS 10/01/2022 2:34 PM

## 2022-10-01 NOTE — Progress Notes (Signed)
Spent most of shift in room. Did come out for meals and meds. Less confusion noted. Denies SI, HI, aVH. Medication compliant. Voiced no concerns or complaints. Slept well. ENcouragment and support provided. Safety checks maintained. MEds given as prescribed. Pt receptive and remains safe on unit with q 15 min checks.

## 2022-10-01 NOTE — Care Management Important Message (Signed)
Important Message  Patient Details  Name: RONDLE LOHSE MRN: [id number] Date of Birth: 1976/01/06   Medicare Important Message Given:  N/A - LOS <3 / Initial given by admissions  CSW reviewed IM with guardian yesterday, 09/30/22. IM was signed and guardian was given a copy. Guardian began the appeal process today.    Glenis Smoker, LCSW 10/01/2022, 2:37 PM

## 2022-10-01 NOTE — BHH Counselor (Signed)
CSW contacted guardian, Daemian Gahm (mother at [phone]). She was notified of discharge. Duran stated that pt is not ready to come home. She expressed that pt's blood pressure has been high and he is so full of hate currently. She shared that she has spoken with him the even prior and pt was still not himself. CSW inquired regarding what pt's baseline looked like. Erhart explained that pt is typically happy and wants to go places. She stated he's not perfect, he talked to himself but does not have anger issues, which she states that he has now. Wohl shared that she plans to appeal the discharge. CSW explained this process to her again. She inquired regarding the contact information for [employer] and CSW explained where it was on the paperwork which she received a copy of yesterday. She shared that she found it. No other concerns expressed. Contact ended without incident.   [name]. Algis Greenhouse, MSW, LCSW, LCAS 10/01/2022 1:26 PM

## 2022-10-01 NOTE — Discharge Summary (Signed)
Physician Discharge Summary Note  Patient:  Terrance Watkins is an 47 y.o., male MRN:  [id number] DOB:  1976/01/12 Patient phone:  [phone] (home)  Patient address:   [address] [place] [address],  Total Time spent with patient: 45 minutes  Date of Admission:  09/23/2022 Date of Discharge: 10/01/2022  Reason for Admission:  psychotic  Principal Problem: Paranoid schizophrenia [facility]) Discharge Diagnoses: Principal Problem:   Paranoid schizophrenia (HCC)   Past Psychiatric History: schizophrenia, paranoid type; depression, anxiety  Past Medical History:  Past Medical History:  Diagnosis Date   Paranoid schizophrenia (HCC)    History reviewed. No pertinent surgical history. Family History: History reviewed. No pertinent family history. Family Psychiatric  History: none Social History:  Social History   Substance and Sexual Activity  Alcohol Use Yes   Alcohol/week: 2.0 standard drinks of alcohol   Types: 2 Cans of beer per week     Social History   Substance and Sexual Activity  Drug Use Yes   Types: Marijuana    Social History   Socioeconomic History   Marital status: Single    Spouse name: Not on file   Number of children: Not on file   Years of education: Not on file   Highest education level: Not on file  Occupational History   Not on file  Tobacco Use   Smoking status: Every Day    Types: Cigarettes   Smokeless tobacco: Former  Advertising account planner   Vaping status: Some Days   Substances: CBD  Substance and Sexual Activity   Alcohol use: Yes    Alcohol/week: 2.0 standard drinks of alcohol    Types: 2 Cans of beer per week   Drug use: Yes    Types: Marijuana   Sexual activity: Yes  Other Topics Concern   Not on file  Social History Narrative   Not on file   Social Determinants of Health   Financial Resource Strain: Not on file  Food Insecurity: No Food Insecurity (09/23/2022)   Hunger Vital Sign    Worried About Running Out of Food in the  Last Year: Never true    Ran Out of Food in the Last Year: Never true  Transportation Needs: No Transportation Needs (09/23/2022)   PRAPARE - Administrator, Civil Service (Medical): No    Lack of Transportation (Non-Medical): No  Physical Activity: Not on file  Stress: Not on file  Social Connections: Not on file    Hospital Course:   Terrance Watkins is a 47 y.o. male with past medical history of schizophrenia who presents to the ED for psychiatric evaluation. Patient brought to the ED under IVC after his mother stated he has not been taking his medications and has been "arguing with people who are not there." Medications were started and adjusted along with therapy.  The client stabilized and has met maximum benefit of hospitalization.  He does have an ACT team, [facility], who will continue to follow him. Denies suicidal/homicidal ideations, hallucinations, paranoia, and substance abuse.  Psychiatrically stable for discharge.  Discharge instructions provided with explanations along with Rx and crisis number.  Musculoskeletal: Strength & Muscle Tone: within normal limits Gait & Station: normal Patient leans: N/A  Psychiatric Specialty Exam: Physical Exam Vitals and nursing note reviewed.  Constitutional:      Appearance: Normal appearance.  HENT:     Head: Normocephalic.     Nose: Nose normal.  Pulmonary:     Effort:  Pulmonary effort is normal.  Musculoskeletal:        General: Normal range of motion.     Cervical back: Normal range of motion.  Neurological:     General: No focal deficit present.     Mental Status: He is alert and oriented to person, place, and time.  Psychiatric:        Attention and Perception: Attention and perception normal.        Mood and Affect: Mood is anxious.        Speech: Speech normal.        Behavior: Behavior normal. Behavior is cooperative.        Thought Content: Thought content normal.        Cognition and Memory: Cognition and  memory normal.        Judgment: Judgment normal.     Review of Systems  Psychiatric/Behavioral:  The patient is nervous/anxious.   All other systems reviewed and are negative.    Blood pressure (!) 150/71, pulse (!) 58, temperature 97.8 F (36.6 C), resp. rate 19, height 5\' 11"  (1.803 m), weight 91.6 kg, SpO2 100%.Body mass index is 28.17 kg/m.  General Appearance: Casual  Eye Contact:  Good  Speech:  Normal Rate  Volume:  Normal  Mood:  Anxious  Affect:  Congruent  Thought Process:  Coherent  Orientation:  Full (Time, Place, and Person)  Thought Content:  Logical  Suicidal Thoughts:  No  Homicidal Thoughts:  No  Memory:  Immediate;   Good Recent;   Good Remote;   Good  Judgement:  Fair  Insight:  Fair  Psychomotor Activity:  Normal  Concentration:  Concentration: Good and Attention Span: Good  Recall:  Good  Fund of Knowledge:  Good  Language:  Good  Akathisia:  No  Handed:  Right  AIMS (if indicated):     Assets:  Housing Leisure Time Physical Health Resilience Social Support  ADL's:  Intact  Cognition:  WNL  Sleep:  Number of Hours: 5        Physical Exam: Physical Exam Vitals and nursing note reviewed.  Constitutional:      Appearance: Normal appearance.  HENT:     Head: Normocephalic.     Nose: Nose normal.  Pulmonary:     Effort: Pulmonary effort is normal.  Musculoskeletal:        General: Normal range of motion.     Cervical back: Normal range of motion.  Neurological:     General: No focal deficit present.     Mental Status: He is alert and oriented to person, place, and time.  Psychiatric:        Attention and Perception: Attention and perception normal.        Mood and Affect: Mood is anxious.        Speech: Speech normal.        Behavior: Behavior normal. Behavior is cooperative.        Thought Content: Thought content normal.        Cognition and Memory: Cognition and memory normal.        Judgment: Judgment normal.    Review of  Systems  Psychiatric/Behavioral:  The patient is nervous/anxious.   All other systems reviewed and are negative.  Blood pressure (!) 150/71, pulse (!) 58, temperature 97.8 F (36.6 C), resp. rate 19, height 5\' 11"  (1.803 m), weight 91.6 kg, SpO2 100%. Body mass index is 28.17 kg/m.   Social History   Tobacco Use  Smoking Status Every Day   Types: Cigarettes  Smokeless Tobacco Former   Tobacco Cessation:  A prescription for an FDA-approved tobacco cessation medication was offered at discharge and the patient refused   Blood Alcohol level:  Lab Results  Component Value Date   ETH <10 09/22/2022    Metabolic Disorder Labs:  Lab Results  Component Value Date   HGBA1C 5.3 09/25/2022   MPG 105 09/25/2022   No results found for: "PROLACTIN" Lab Results  Component Value Date   CHOL 162 09/25/2022   TRIG 96 09/25/2022   HDL 40 (L) 09/25/2022   CHOLHDL 4.1 09/25/2022   VLDL 19 09/25/2022   LDLCALC 103 (H) 09/25/2022    See Psychiatric Specialty Exam and Suicide Risk Assessment completed by Attending Physician prior to discharge.  Discharge destination:  Home  Is patient on multiple antipsychotic therapies at discharge:  Yes,   Do you recommend tapering to monotherapy for antipsychotics?  No   Has Patient had three or more failed trials of antipsychotic monotherapy by history:  ACT team has a list, [employer]  Recommended Plan for Multiple Antipsychotic Therapies: NA  Discharge Instructions     Diet - low sodium heart healthy   Complete by: As directed    Discharge instructions   Complete by: As directed    Follow up with ACT team, [facility]   Increase activity slowly   Complete by: As directed       Allergies as of 10/01/2022   No Known Allergies      Medication List     TAKE these medications      Indication  divalproex 500 MG DR tablet Commonly known as: DEPAKOTE Take 1 tablet (500 mg total) by mouth every 12 (twelve) hours.  Indication:  Schizophrenia   escitalopram 5 MG tablet Commonly known as: LEXAPRO Take 1 tablet (5 mg total) by mouth daily.  Indication: Major Depressive Disorder   fluPHENAZine 5 MG tablet Commonly known as: PROLIXIN Take 1 tablet (5 mg total) by mouth 2 (two) times daily. What changed:  medication strength how much to take when to take this  Indication: Schizophrenia   gabapentin 100 MG capsule Commonly known as: NEURONTIN Take 2 capsules (200 mg total) by mouth 3 (three) times daily.  Indication: Generalized Anxiety Disorder   hydrochlorothiazide 25 MG tablet Commonly known as: HYDRODIURIL Take 1 tablet (25 mg total) by mouth daily.  Indication: HTN   lisinopril 20 MG tablet Commonly known as: ZESTRIL Take 1 tablet (20 mg total) by mouth daily.  Indication: High Blood Pressure Disorder   RisperDAL Consta 25 MG injection Generic drug: risperiDONE microspheres Inject 25 mg into the muscle every 14 (fourteen) days. What changed: Another medication with the same name was removed. Continue taking this medication, and follow the directions you see here.  Indication: Schizophrenia   traZODone 100 MG tablet Commonly known as: DESYREL Take 2 tablets (200 mg total) by mouth at bedtime. What changed:  when to take this reasons to take this  Indication: Trouble Sleeping         Follow-up recommendations:   Activity:  as tolerated Diet:  heart healthy diet Schizophrenia, paranoid type: Prolixin 5 mg BID Depakote 500 mg BID, valproic acid level ordered-58 level, WDL Risperdal Constance, confirming last dose    Depression: Lexapro 5 mg daily    Anxiety: Gabapentin 200 mg TID   Insomnia: Trazodone 200 mg daily at bedtime.  Comments:  follow up with [facility]  Signed: Nanine Means, NP 10/01/2022, 9:50 AM

## 2022-10-02 NOTE — Group Note (Signed)
Date:  10/02/2022 Time:  9:03 PM  Group Topic/Focus:  Rediscovering Joy:   The focus of this group is to explore various ways to relieve stress in a positive manner.    Participation Level:  Active  Participation Quality:  Appropriate and Attentive  Affect:  Appropriate  Cognitive:  Alert and Appropriate  Insight: Good  Engagement in Group:  Developing/Improving and Engaged  Modes of Intervention:  Activity and Support  Additional Comments:     Truth Barot 10/02/2022, 9:03 PM

## 2022-10-02 NOTE — Progress Notes (Signed)
Patient is an involuntary admission on 09/23/22 to [facility] for Paranoid Schizophrenia noncompliance with meds. Patient is slightly anxious, eager to talk with staff and peers and can answer questions but occasionally gets sidetracked. Denies S/I, H/I, AVH. Ambulates and performs ADL's independently. Will continue to monitor.

## 2022-10-02 NOTE — Plan of Care (Signed)
  Problem: Education: Goal: Knowledge of General Education information will improve Description Including pain rating scale, medication(s)/side effects and non-pharmacologic comfort measures Outcome: Progressing   Problem: Health Behavior/Discharge Planning: Goal: Ability to manage health-related needs will improve Outcome: Progressing   

## 2022-10-02 NOTE — Group Note (Signed)
Date:  10/02/2022 Time:  1:51 PM  Group Topic/Focus:  Outdoor recreation    Participation Level:  Active  Participation Quality:  Appropriate, Attentive, Sharing, and Supportive  Affect:  Appropriate  Cognitive:  Alert and Appropriate  Insight: Appropriate and Improving  Engagement in Group:  Engaged, Improving, and Supportive  Modes of Intervention:  Discussion, Rapport Building, Socialization, and Support  Additional Comments:    Rosaura Carpenter 10/02/2022, 1:51 PM

## 2022-10-02 NOTE — Group Note (Signed)
Date:  10/02/2022 Time:  11:04 AM  Group Topic/Focus:  Goals Group:   The focus of this group is to help patients establish daily goals to achieve during treatment and discuss how the patient can incorporate goal setting into their daily lives to aide in recovery.     Participation Level:  Active  Participation Quality:  Appropriate, Sharing, and Supportive  Affect:  Appropriate  Cognitive:  Alert and Appropriate  Insight: Appropriate  Engagement in Group:  Supportive  Modes of Intervention:  Discussion, Problem-solving, Rapport Building, and Socialization  Additional Comments:    Rosaura Carpenter 10/02/2022, 11:04 AM

## 2022-10-02 NOTE — Group Note (Signed)
LCSW Group Therapy Note  Group Date: 10/02/2022 Start Time: 1355 End Time: 1440   Type of Therapy and Topic:  Coping Skills  Participation Level:  Active   Description of Group The focus of this group was to determine what unhealthy coping techniques typically are used by group members and what healthy coping techniques would be helpful in coping with various problems. Patients were guided in becoming aware of the differences between healthy and unhealthy coping techniques. Patients were asked to identify 2-3 healthy coping skills they would like to learn to use more effectively.  Therapeutic Goals Patients learned that coping is what human beings do all day long to deal with various situations in their lives Patients defined and discussed healthy vs unhealthy coping techniques Patients identified their preferred coping techniques and identified whether these were healthy or unhealthy Patients determined 2-3 healthy coping skills they would like to become more familiar with and use more often. Patients provided support and ideas to each other   Summary of Patient Progress:  The patient attended group.  Patient proved open to input from peers and feedback from [facility]. Patient demonstrated  insight into the subject matter, was respectful of peers, and participated throughout the entire session. The patient stated that he never had to use coping skills in the past but he does need them now. The patient did a good job with finding and listing the coping skills he would like to try. Thre patient stated that solving problems helps.      Marshell Levan, LCSWA 10/02/2022  3:02 PM

## 2022-10-02 NOTE — Progress Notes (Signed)
[facility] MD Progress Note  10/02/2022 6:56 AM Terrance Watkins  MRN:  [id number]  Subjective:   Terrance Watkins is a 47 y.o. male with past medical history of schizophrenia who presents to the ED for psychiatric evaluation. Patient brought to the ED under IVC after his mother stated he has not been taking his medications and has been "arguing with people who are not there."   Nurses notes, labs, vital signs, and team meeting reviewed prior to meeting with the client.  Client continues to be stable, he reported processing in group potential triggers for his guardian.  He is trying to discover why she keeps rejecting treatment requests.  His depression was 2/10 earlier this morning which decreased to a 1/10 on assessment later.  Denies anxiety, "I don't have anxiety".  Sleep is "really good", and appetite is improving as he is eating snacks now.  Denies side effects from his medications, he desired not to be on gabapentin, discontinued.  He continues to be rational, logical and coherent, calm, and cooperative.  Principal Problem: Paranoid schizophrenia (HCC) Diagnosis: Principal Problem:   Paranoid schizophrenia (HCC)  Total Time spent with patient: 30 minutes  Past Psychiatric History: schizophrenia  Past Medical History:  Past Medical History:  Diagnosis Date   Paranoid schizophrenia (HCC)    History reviewed. No pertinent surgical history. Family History: History reviewed. No pertinent family history. Family Psychiatric  History: none Social History:  Social History   Substance and Sexual Activity  Alcohol Use Yes   Alcohol/week: 2.0 standard drinks of alcohol   Types: 2 Cans of beer per week     Social History   Substance and Sexual Activity  Drug Use Yes   Types: Marijuana    Social History   Socioeconomic History   Marital status: Single    Spouse name: Not on file   Number of children: Not on file   Years of education: Not on file   Highest education level: Not on file   Occupational History   Not on file  Tobacco Use   Smoking status: Every Day    Types: Cigarettes   Smokeless tobacco: Former  Advertising account planner   Vaping status: Some Days   Substances: CBD  Substance and Sexual Activity   Alcohol use: Yes    Alcohol/week: 2.0 standard drinks of alcohol    Types: 2 Cans of beer per week   Drug use: Yes    Types: Marijuana   Sexual activity: Yes  Other Topics Concern   Not on file  Social History Narrative   Not on file   Social Determinants of Health   Financial Resource Strain: Not on file  Food Insecurity: No Food Insecurity (09/23/2022)   Hunger Vital Sign    Worried About Running Out of Food in the Last Year: Never true    Ran Out of Food in the Last Year: Never true  Transportation Needs: No Transportation Needs (09/23/2022)   PRAPARE - Administrator, Civil Service (Medical): No    Lack of Transportation (Non-Medical): No  Physical Activity: Not on file  Stress: Not on file  Social Connections: Not on file   Additional Social History: lives with his mother    Sleep: good  Appetite:  Fair  Current Medications: Current Facility-Administered Medications  Medication Dose Route Frequency Provider Last Rate Last Admin   acetaminophen (TYLENOL) tablet 650 mg  650 mg Oral Q6H PRN Willeen Cass, Christal H, NP  alum & mag hydroxide-simeth (MAALOX/MYLANTA) 200-200-20 MG/5ML suspension 30 mL  30 mL Oral Q4H PRN Bennett, Christal H, NP       cloNIDine (CATAPRES) tablet 0.1 mg  0.1 mg Oral Q6H PRN Charm Rings, NP   0.1 mg at 10/01/22 1851   diphenhydrAMINE (BENADRYL) capsule 25 mg  25 mg Oral Q6H PRN Charm Rings, NP       diphenhydrAMINE (BENADRYL) capsule 50 mg  50 mg Oral TID PRN Bennett, Christal H, NP       Or   diphenhydrAMINE (BENADRYL) injection 50 mg  50 mg Intramuscular TID PRN Bennett, Christal H, NP       divalproex (DEPAKOTE) DR tablet 500 mg  500 mg Oral Q12H Charm Rings, NP   500 mg at 10/01/22 2126    escitalopram (LEXAPRO) tablet 5 mg  5 mg Oral Daily Charm Rings, NP   5 mg at 10/01/22 1610   fluPHENAZine (PROLIXIN) tablet 5 mg  5 mg Oral BID Lewanda Rife, MD   5 mg at 10/01/22 1721   haloperidol (HALDOL) tablet 5 mg  5 mg Oral TID PRN Bennett, Christal H, NP       Or   haloperidol lactate (HALDOL) injection 5 mg  5 mg Intramuscular TID PRN Bennett, Christal H, NP       hydrochlorothiazide (HYDRODIURIL) tablet 25 mg  25 mg Oral Daily Charm Rings, NP   25 mg at 10/01/22 0854   lisinopril (ZESTRIL) tablet 20 mg  20 mg Oral Daily Bennett, Christal H, NP   20 mg at 10/01/22 0854   LORazepam (ATIVAN) tablet 2 mg  2 mg Oral TID PRN Bennett, Christal H, NP       Or   LORazepam (ATIVAN) injection 2 mg  2 mg Intramuscular TID PRN Bennett, Christal H, NP       magnesium hydroxide (MILK OF MAGNESIA) suspension 30 mL  30 mL Oral Daily PRN Bennett, Christal H, NP       nicotine (NICODERM CQ - dosed in mg/24 hours) patch 14 mg  14 mg Transdermal Daily Lewanda Rife, MD       risperiDONE microspheres (RISPERDAL CONSTA) injection 25 mg  25 mg Intramuscular Q14 Days Charm Rings, NP   25 mg at 10/01/22 1126   risperiDONE microspheres (RISPERDAL CONSTA) injection 50 mg  50 mg Intramuscular Q14 Days Charm Rings, NP   50 mg at 10/01/22 1126   traZODone (DESYREL) tablet 200 mg  200 mg Oral QHS Charm Rings, NP   200 mg at 10/01/22 2126    Lab Results:  No results found for this or any previous visit (from the past 48 hour(s)).    Blood Alcohol level:  Lab Results  Component Value Date   ETH <10 09/22/2022    Metabolic Disorder Labs: Lab Results  Component Value Date   HGBA1C 5.3 09/25/2022   MPG 105 09/25/2022   No results found for: "PROLACTIN" Lab Results  Component Value Date   CHOL 162 09/25/2022   TRIG 96 09/25/2022   HDL 40 (L) 09/25/2022   CHOLHDL 4.1 09/25/2022   VLDL 19 09/25/2022   LDLCALC 103 (H) 09/25/2022     Musculoskeletal: Strength & Muscle  Tone: within normal limits Gait & Station: normal Patient leans: N/A  Psychiatric Specialty Exam: Physical Exam Vitals and nursing note reviewed.  Constitutional:      Appearance: Normal appearance.  HENT:     Head: Normocephalic.  Nose: Nose normal.  Pulmonary:     Effort: Pulmonary effort is normal.  Musculoskeletal:        General: Normal range of motion.     Cervical back: Normal range of motion.  Neurological:     General: No focal deficit present.     Mental Status: He is alert and oriented to person, place, and time.  Psychiatric:        Attention and Perception: Attention and perception normal.        Mood and Affect: Mood is anxious.        Speech: Speech normal.        Behavior: Behavior normal. Behavior is cooperative.        Thought Content: Thought content normal.        Cognition and Memory: Cognition and memory normal.        Judgment: Judgment normal.     Review of Systems  Psychiatric/Behavioral:  The patient is nervous/anxious.   All other systems reviewed and are negative.   Blood pressure 122/67, pulse 66, temperature 97.6 F (36.4 C), temperature source Oral, resp. rate 18, height 5\' 11"  (1.803 m), weight 91.6 kg, SpO2 99%.Body mass index is 28.17 kg/m.  General Appearance: Casual  Eye Contact:  Fair  Speech:  WDL  Volume:  WDL  Mood:  Anxious, mild  Affect:  Congruent  Thought Procedure:  Logical  Orientation:  Full (Time, Place, and Person)  Thought Content:  goal oriented, WDL  Suicidal Thoughts:  No  Homicidal Thoughts:  No  Memory:  Immediate;   Fair Recent;   Fair Remote;   Fair  Judgement:  Fair  Insight:  Good  Psychomotor Activity:  WDL  Concentration:  Good  Recall:  Fair  Fund of Knowledge:  Good  Language:  Good  Akathisia:  No  Handed:  Right  AIMS (if indicated):     Assets:  Leisure Time Physical Health Resilience  ADL's:  Intact  Cognition:  WDL  Sleep:  Number of Hours: 5      Physical Exam: Physical  Exam Vitals and nursing note reviewed.  Constitutional:      Appearance: Normal appearance.  HENT:     Head: Normocephalic.     Nose: Nose normal.  Pulmonary:     Effort: Pulmonary effort is normal.  Musculoskeletal:        General: Normal range of motion.     Cervical back: Normal range of motion.  Neurological:     General: No focal deficit present.     Mental Status: He is alert and oriented to person, place, and time.  Psychiatric:        Attention and Perception: Attention and perception normal.        Mood and Affect: Mood is anxious.        Speech: Speech normal.        Behavior: Behavior normal. Behavior is cooperative.        Thought Content: Thought content normal.        Cognition and Memory: Cognition and memory normal.        Judgment: Judgment normal.    Review of Systems  Psychiatric/Behavioral:  The patient is nervous/anxious.   All other systems reviewed and are negative.  Blood pressure 122/67, pulse 66, temperature 97.6 F (36.4 C), temperature source Oral, resp. rate 18, height 5\' 11"  (1.803 m), weight 91.6 kg, SpO2 99%. Body mass index is 28.17 kg/m.   Treatment Plan Summary: Daily  contact with patient to assess and evaluate symptoms and progress in treatment, Medication management, and Plan : Schizophrenia, paranoid type: Prolixin 5 mg BID Depakote 500 mg BID, valproic acid level ordered-58 level, WDL Risperdal Constance 75 mg IM on 7/19  Depression: Lexapro 5 mg daily   Anxiety: Gabapentin 200 mg TID discontinued per client's choice  Insomnia: Trazodone 200 mg daily at bedtime.  LOS: Friday discharge delay because of Medicare appeal by his guardian  Nanine Means, NP 10/02/2022, 6:56 AM

## 2022-10-02 NOTE — BH IP Treatment Plan (Signed)
Interdisciplinary Treatment and Diagnostic Plan Update  10/02/2022 Time of Session: 11:14 am REASE WENCE MRN: [id number]  Principal Diagnosis: Paranoid schizophrenia [facility])  Secondary Diagnoses: Principal Problem:   Paranoid schizophrenia (HCC)   Current Medications:  Current Facility-Administered Medications  Medication Dose Route Frequency Provider Last Rate Last Admin   acetaminophen (TYLENOL) tablet 650 mg  650 mg Oral Q6H PRN Bennett, Christal H, NP       alum & mag hydroxide-simeth (MAALOX/MYLANTA) 200-200-20 MG/5ML suspension 30 mL  30 mL Oral Q4H PRN Bennett, Christal H, NP       cloNIDine (CATAPRES) tablet 0.1 mg  0.1 mg Oral Q6H PRN Charm Rings, NP   0.1 mg at 10/01/22 1851   diphenhydrAMINE (BENADRYL) capsule 25 mg  25 mg Oral Q6H PRN Charm Rings, NP       diphenhydrAMINE (BENADRYL) capsule 50 mg  50 mg Oral TID PRN Bennett, Christal H, NP       Or   diphenhydrAMINE (BENADRYL) injection 50 mg  50 mg Intramuscular TID PRN Bennett, Christal H, NP       divalproex (DEPAKOTE) DR tablet 500 mg  500 mg Oral Q12H Charm Rings, NP   500 mg at 10/02/22 2725   escitalopram (LEXAPRO) tablet 5 mg  5 mg Oral Daily Charm Rings, NP   5 mg at 10/02/22 3664   fluPHENAZine (PROLIXIN) tablet 5 mg  5 mg Oral BID Lewanda Rife, MD   5 mg at 10/02/22 4034   haloperidol (HALDOL) tablet 5 mg  5 mg Oral TID PRN Willeen Cass, Christal H, NP       Or   haloperidol lactate (HALDOL) injection 5 mg  5 mg Intramuscular TID PRN Bennett, Christal H, NP       hydrochlorothiazide (HYDRODIURIL) tablet 25 mg  25 mg Oral Daily Charm Rings, NP   25 mg at 10/02/22 7425   lisinopril (ZESTRIL) tablet 20 mg  20 mg Oral Daily Bennett, Christal H, NP   20 mg at 10/02/22 9563   LORazepam (ATIVAN) tablet 2 mg  2 mg Oral TID PRN Bennett, Christal H, NP       Or   LORazepam (ATIVAN) injection 2 mg  2 mg Intramuscular TID PRN Bennett, Christal H, NP       magnesium hydroxide (MILK OF MAGNESIA) suspension  30 mL  30 mL Oral Daily PRN Bennett, Christal H, NP       nicotine (NICODERM CQ - dosed in mg/24 hours) patch 14 mg  14 mg Transdermal Daily Parmar, Meenakshi, MD       risperiDONE microspheres (RISPERDAL CONSTA) injection 25 mg  25 mg Intramuscular Q14 Days Charm Rings, NP   25 mg at 10/01/22 1126   risperiDONE microspheres (RISPERDAL CONSTA) injection 50 mg  50 mg Intramuscular Q14 Days Charm Rings, NP   50 mg at 10/01/22 1126   traZODone (DESYREL) tablet 200 mg  200 mg Oral QHS Charm Rings, NP   200 mg at 10/01/22 2126   PTA Medications: Medications Prior to Admission  Medication Sig Dispense Refill Last Dose   fluPHENAZine (PROLIXIN) 10 MG tablet Take 10 mg by mouth 3 (three) times daily.      RISPERDAL CONSTA 25 MG injection Inject 25 mg into the muscle every 14 (fourteen) days.      RISPERDAL CONSTA 50 MG injection Inject 50 mg into the muscle every 14 (fourteen) days.      traZODone (DESYREL) 100 MG  tablet Take 200 mg by mouth at bedtime as needed.       Patient Stressors: Other: Delusional, paranoid    Patient Strengths: Supportive family/friends   Treatment Modalities: Medication Management, Group therapy, Case management,  1 to 1 session with clinician, Psychoeducation, Recreational therapy.   Physician Treatment Plan for Primary Diagnosis: Paranoid schizophrenia (HCC) Long Term Goal(s): Improvement in symptoms so as ready for discharge   Short Term Goals: Ability to identify changes in lifestyle to reduce recurrence of condition will improve Ability to verbalize feelings will improve Ability to disclose and discuss suicidal ideas Ability to demonstrate self-control will improve Ability to identify and develop effective coping behaviors will improve Compliance with prescribed medications will improve Ability to identify triggers associated with substance abuse/mental health issues will improve  Medication Management: Evaluate patient's response, side effects,  and tolerance of medication regimen.  Therapeutic Interventions: 1 to 1 sessions, Unit Group sessions and Medication administration.  Evaluation of Outcomes: Progressing  Physician Treatment Plan for Secondary Diagnosis: Principal Problem:   Paranoid schizophrenia (HCC)  Long Term Goal(s): Improvement in symptoms so as ready for discharge   Short Term Goals: Ability to identify changes in lifestyle to reduce recurrence of condition will improve Ability to verbalize feelings will improve Ability to disclose and discuss suicidal ideas Ability to demonstrate self-control will improve Ability to identify and develop effective coping behaviors will improve Compliance with prescribed medications will improve Ability to identify triggers associated with substance abuse/mental health issues will improve     Medication Management: Evaluate patient's response, side effects, and tolerance of medication regimen.  Therapeutic Interventions: 1 to 1 sessions, Unit Group sessions and Medication administration.  Evaluation of Outcomes: Progressing   RN Treatment Plan for Primary Diagnosis: Paranoid schizophrenia (HCC) Long Term Goal(s): Knowledge of disease and therapeutic regimen to maintain health will improve  Short Term Goals: Ability to verbalize frustration and anger appropriately will improve, Ability to demonstrate self-control, Ability to participate in decision making will improve, Ability to verbalize feelings will improve, Ability to identify and develop effective coping behaviors will improve, and Compliance with prescribed medications will improve  Medication Management: RN will administer medications as ordered by provider, will assess and evaluate patient's response and provide education to patient for prescribed medication. RN will report any adverse and/or side effects to prescribing provider.  Therapeutic Interventions: 1 on 1 counseling sessions, Psychoeducation, Medication  administration, Evaluate responses to treatment, Monitor vital signs and CBGs as ordered, Perform/monitor CIWA, COWS, AIMS and Fall Risk screenings as ordered, Perform wound care treatments as ordered.  Evaluation of Outcomes: Progressing   LCSW Treatment Plan for Primary Diagnosis: Paranoid schizophrenia (HCC) Long Term Goal(s): Safe transition to appropriate next level of care at discharge, Engage patient in therapeutic group addressing interpersonal concerns.  Short Term Goals: Engage patient in aftercare planning with referrals and resources, Increase social support, Increase ability to appropriately verbalize feelings, Increase emotional regulation, Facilitate acceptance of mental health diagnosis and concerns, Identify triggers associated with mental health/substance abuse issues, and Increase skills for wellness and recovery  Therapeutic Interventions: Assess for all discharge needs, 1 to 1 time with Social worker, Explore available resources and support systems, Assess for adequacy in community support network, Educate family and significant other(s) on suicide prevention, Complete Psychosocial Assessment, Interpersonal group therapy.  Evaluation of Outcomes: Progressing   Progress in Treatment: Attending groups: Yes. Participating in groups: Yes. Taking medication as prescribed: Yes. Toleration medication: Yes Family/Significant other contact made: Yes, individual(s) contacted:  [place], [place], [phone] Patient understands diagnosis: Yes. Discussing patient identified problems/goals with staff: Yes. Medical problems stabilized or resolved: Yes. Denies suicidal/homicidal ideation: Yes. Issues/concerns per patient self-inventory: No. Other: none  New problem(s) identified: No, Describe:  none  New Short Term/Long Term Goal(s): elimination of symptoms of psychosis, medication management for mood stabilization; elimination of SI thoughts; development of comprehensive mental  wellness/sobriety plan. Update 10/02/2022: No changes at this time.    Patient Goals:  "find ways to learn to I wont get upset. I can walk away and get upset" Update 10/02/2022: No changes at this time.    Discharge Plan or Barriers: CSW to assist in the development of appropriate discharge plans. Update 10/02/2022: Patients guardian appealed his discharge, so they are reviewing his case.   Reason for Continuation of Hospitalization: Anxiety Depression Hallucinations Medication stabilization Suicidal ideation  Estimated Length of Stay: 1-7 days Update 10/02/2022: TBD  Last 3 [place] Suicide Severity Risk Score: Flowsheet Row Admission (Current) from 09/23/2022 in [facility] ED from 09/22/2022 in [facility] Emergency Department at [facility]  C-SSRS RISK CATEGORY No Risk No Risk       Last PHQ 2/9 Scores:     No data to display          Scribe for Treatment Team: Marshell Levan, LCSW 10/02/2022 11:14 AM

## 2022-10-02 NOTE — Progress Notes (Signed)
Blood pressure noted to be 192/94 so medicated with prn catapres per orders. Will continue to monitor.

## 2022-10-02 NOTE — B[facility] Counselor (Signed)
The LCSWA contacted the patient's guardian, Kathie Rhodes Abebe/mother [phone], in order to inform her that the patients appeal had been denied and that as of 12:00 pm 10/03/22 the patient would begin to be charged for stay. The patients guardian stated that she will pick the patient up tomorrow at 12:00 pm.    Patric Dykes, LCSWA  10/02/22 5:04 pm

## 2022-10-02 NOTE — Plan of Care (Signed)
Problem: Education: Goal: Knowledge of General Education information will improve Description: Including pain rating scale, medication(s)/side effects and non-pharmacologic comfort measures Outcome: Progressing   Problem: Health Behavior/Discharge Planning: Goal: Ability to manage health-related needs will improve Outcome: Progressing   Problem: Clinical Measurements: Goal: Ability to maintain clinical measurements within normal limits will improve Outcome: Progressing Goal: Will remain free from infection Outcome: Progressing Goal: Diagnostic test results will improve Outcome: Progressing Goal: Respiratory complications will improve Outcome: Progressing Goal: Cardiovascular complication will be avoided Outcome: Progressing   Problem: Activity: Goal: Risk for activity intolerance will decrease Outcome: Progressing   Problem: Nutrition: Goal: Adequate nutrition will be maintained Outcome: Progressing   Problem: Coping: Goal: Level of anxiety will decrease Outcome: Progressing   Problem: Elimination: Goal: Will not experience complications related to bowel motility Outcome: Progressing Goal: Will not experience complications related to urinary retention Outcome: Progressing   Problem: Pain Managment: Goal: General experience of comfort will improve Outcome: Progressing   Problem: Safety: Goal: Ability to remain free from injury will improve Outcome: Progressing   Problem: Skin Integrity: Goal: Risk for impaired skin integrity will decrease Outcome: Progressing   Problem: Education: Goal: Knowledge of [place] [facility] Education information/materials will improve Outcome: Progressing Goal: Emotional status will improve Outcome: Progressing Goal: Mental status will improve Outcome: Progressing Goal: Verbalization of understanding the information provided will improve Outcome: Progressing   Problem: Activity: Goal: Interest or engagement in activities will  improve Outcome: Progressing Goal: Sleeping patterns will improve Outcome: Progressing   Problem: Coping: Goal: Ability to verbalize frustrations and anger appropriately will improve Outcome: Progressing Goal: Ability to demonstrate self-control will improve Outcome: Progressing   Problem: Health Behavior/Discharge Planning: Goal: Identification of resources available to assist in meeting health care needs will improve Outcome: Progressing Goal: Compliance with treatment plan for underlying cause of condition will improve Outcome: Progressing   Problem: Physical Regulation: Goal: Ability to maintain clinical measurements within normal limits will improve Outcome: Progressing   Problem: Safety: Goal: Periods of time without injury will increase Outcome: Progressing   Problem: Education: Goal: Utilization of techniques to improve thought processes will improve Outcome: Progressing Goal: Knowledge of the prescribed therapeutic regimen will improve Outcome: Progressing   Problem: Activity: Goal: Interest or engagement in leisure activities will improve Outcome: Progressing Goal: Imbalance in normal sleep/wake cycle will improve Outcome: Progressing   Problem: Coping: Goal: Coping ability will improve Outcome: Progressing Goal: Will verbalize feelings Outcome: Progressing   Problem: Health Behavior/Discharge Planning: Goal: Ability to make decisions will improve Outcome: Progressing Goal: Compliance with therapeutic regimen will improve Outcome: Progressing   Problem: Role Relationship: Goal: Will demonstrate positive changes in social behaviors and relationships Outcome: Progressing   Problem: Safety: Goal: Ability to disclose and discuss suicidal ideas will improve Outcome: Progressing Goal: Ability to identify and utilize support systems that promote safety will improve Outcome: Progressing   Problem: Self-Concept: Goal: Will verbalize positive feelings about  self Outcome: Progressing Goal: Level of anxiety will decrease Outcome: Progressing   Problem: Activity: Goal: Will verbalize the importance of balancing activity with adequate rest periods Outcome: Progressing   Problem: Education: Goal: Will be free of psychotic symptoms Outcome: Progressing Goal: Knowledge of the prescribed therapeutic regimen will improve Outcome: Progressing   Problem: Coping: Goal: Coping ability will improve Outcome: Progressing Goal: Will verbalize feelings Outcome: Progressing   Problem: Health Behavior/Discharge Planning: Goal: Compliance with prescribed medication regimen will improve Outcome: Progressing   Problem: Nutritional: Goal: Ability to achieve adequate nutritional intake  will improve Outcome: Progressing

## 2022-10-02 NOTE — Progress Notes (Signed)
Pt. Reported he has concern about being addicted to gabapentin . Scheduled meds were taken at the 2100 med pass.

## 2022-10-03 MED ORDER — CLONIDINE HCL 0.1 MG PO TABS: 0.1000 mg | ORAL_TABLET | Freq: Two times a day (BID) | ORAL | 11 refills | Status: AC

## 2022-10-03 NOTE — Plan of Care (Signed)
Pt. Verbalized feeling better. Taking meds and compliant.

## 2022-10-03 NOTE — Progress Notes (Signed)
  [facility] Adult Case Management Discharge Plan :  Will you be returning to the same living situation after discharge:  Yes,  [address]  At discharge, do you have transportation home?: Yes,  the patients mother will be picking him up. Do you have the ability to pay for your medications: Yes,  the patient has medicaid.  Release of information consent forms completed and in the chart;  Patient's signature needed at discharge.  Patient to Follow up at:  Follow-up Information     [facility] Schedule an appointment as soon as possible for a visit in 1 day(s).   Contact information: [address] [place] [address] [phone]                 Next level of care provider has access to [facility]:yes  Safety Planning and Suicide Prevention discussed: Yes,  Pasha Broad, mother, [phone]     Has patient been referred to the Quitline?: Patient refused referral for treatment  Patient has been referred for addiction treatment: No known substance use disorder.  Marshell Levan, LCSW 10/03/2022, 9:02 AM

## 2022-10-03 NOTE — Progress Notes (Signed)
Resident slept through night. Compliant with meds last evening. Denies SI/HI.

## 2022-10-03 NOTE — Progress Notes (Signed)
The SW notified this provider that the Medicare appeal by his guardian was denied and he needs to discharge by 12 to prevent charges.  The client continues to deny suicidal/homicidal ideations, hallucinations, and substance abuse.  His discharge was completed on Friday when he originally scheduled to leave.  ACT team also notified and updated on Friday who will follow up with him this week.  He did receive his Risperdal Constance injection on Friday, no side effects.  Nanine Means, PMHNP

## 2022-10-03 NOTE — Progress Notes (Signed)
Patient is scheduled to be discharged today - awaiting mother to pick up by noon. Paperwork completed, surveys complete. Resident given clothes to change into. Calm, cooperative, friendly with staff and peers.

## 2022-10-03 NOTE — Plan of Care (Signed)
Patient discharged home. Escorted to exit with paperwork and belongings by MT.

## 2022-10-14 ENCOUNTER — Other Ambulatory Visit (HOSPITAL_COMMUNITY): Payer: Self-pay | Admitting: Psychiatry
